# Patient Record
Sex: Male | Born: 1935 | Race: Black or African American | Hispanic: No | Marital: Married | Smoking: Never smoker
Health system: Southern US, Community
[De-identification: ages and names within clinical notes are randomized; demographics above are authoritative.]

## PROBLEM LIST (undated history)

## (undated) DIAGNOSIS — F329 Major depressive disorder, single episode, unspecified: Secondary | ICD-10-CM

## (undated) DIAGNOSIS — H409 Unspecified glaucoma: Secondary | ICD-10-CM

## (undated) DIAGNOSIS — E7800 Pure hypercholesterolemia, unspecified: Secondary | ICD-10-CM

## (undated) DIAGNOSIS — H269 Unspecified cataract: Secondary | ICD-10-CM

## (undated) DIAGNOSIS — F32A Depression, unspecified: Secondary | ICD-10-CM

## (undated) DIAGNOSIS — I10 Essential (primary) hypertension: Secondary | ICD-10-CM

## (undated) DIAGNOSIS — D649 Anemia, unspecified: Secondary | ICD-10-CM

## (undated) DIAGNOSIS — I82409 Acute embolism and thrombosis of unspecified deep veins of unspecified lower extremity: Secondary | ICD-10-CM

## (undated) DIAGNOSIS — I639 Cerebral infarction, unspecified: Secondary | ICD-10-CM

## (undated) DIAGNOSIS — K219 Gastro-esophageal reflux disease without esophagitis: Secondary | ICD-10-CM

## (undated) HISTORY — PX: OTHER SURGICAL HISTORY: SHX169

## (undated) HISTORY — DX: Cerebral infarction, unspecified: I63.9

## (undated) HISTORY — DX: Pure hypercholesterolemia, unspecified: E78.00

## (undated) HISTORY — DX: Essential (primary) hypertension: I10

## (undated) HISTORY — DX: Gastro-esophageal reflux disease without esophagitis: K21.9

## (undated) HISTORY — DX: Unspecified cataract: H26.9

## (undated) HISTORY — DX: Major depressive disorder, single episode, unspecified: F32.9

## (undated) HISTORY — PX: IVC FILTER PLACEMENT (ARMC HX): HXRAD1551

## (undated) HISTORY — DX: Depression, unspecified: F32.A

## (undated) HISTORY — DX: Unspecified glaucoma: H40.9

---

## 2005-02-15 DIAGNOSIS — I639 Cerebral infarction, unspecified: Secondary | ICD-10-CM

## 2005-02-15 HISTORY — DX: Cerebral infarction, unspecified: I63.9

## 2005-08-10 ENCOUNTER — Inpatient Hospital Stay (HOSPITAL_COMMUNITY): Admission: EM | Admit: 2005-08-10 | Discharge: 2005-08-23 | Payer: Self-pay | Admitting: Internal Medicine

## 2005-08-10 ENCOUNTER — Ambulatory Visit: Payer: Self-pay | Admitting: Internal Medicine

## 2005-08-20 ENCOUNTER — Ambulatory Visit: Payer: Self-pay | Admitting: Internal Medicine

## 2005-09-22 ENCOUNTER — Ambulatory Visit: Payer: Self-pay | Admitting: Internal Medicine

## 2005-09-25 ENCOUNTER — Emergency Department (HOSPITAL_COMMUNITY): Admission: EM | Admit: 2005-09-25 | Discharge: 2005-09-25 | Payer: Self-pay | Admitting: Emergency Medicine

## 2005-09-28 ENCOUNTER — Ambulatory Visit: Payer: Self-pay | Admitting: Internal Medicine

## 2005-09-28 ENCOUNTER — Inpatient Hospital Stay (HOSPITAL_COMMUNITY): Admission: EM | Admit: 2005-09-28 | Discharge: 2005-10-02 | Payer: Self-pay | Admitting: Emergency Medicine

## 2005-10-04 ENCOUNTER — Ambulatory Visit: Payer: Self-pay | Admitting: Internal Medicine

## 2005-10-15 ENCOUNTER — Ambulatory Visit: Payer: Self-pay | Admitting: Internal Medicine

## 2005-10-25 ENCOUNTER — Ambulatory Visit (HOSPITAL_COMMUNITY): Admission: RE | Admit: 2005-10-25 | Discharge: 2005-10-25 | Payer: Self-pay | Admitting: Internal Medicine

## 2005-10-25 ENCOUNTER — Ambulatory Visit: Admission: RE | Admit: 2005-10-25 | Discharge: 2005-10-25 | Payer: Self-pay | Admitting: Internal Medicine

## 2005-11-02 ENCOUNTER — Emergency Department (HOSPITAL_COMMUNITY): Admission: EM | Admit: 2005-11-02 | Discharge: 2005-11-03 | Payer: Self-pay | Admitting: Emergency Medicine

## 2005-11-02 ENCOUNTER — Ambulatory Visit: Payer: Self-pay | Admitting: Internal Medicine

## 2005-11-04 ENCOUNTER — Ambulatory Visit: Payer: Self-pay | Admitting: Internal Medicine

## 2005-11-16 ENCOUNTER — Inpatient Hospital Stay (HOSPITAL_COMMUNITY): Admission: EM | Admit: 2005-11-16 | Discharge: 2005-11-18 | Payer: Self-pay | Admitting: Emergency Medicine

## 2005-11-18 ENCOUNTER — Ambulatory Visit: Payer: Self-pay | Admitting: Physical Medicine & Rehabilitation

## 2005-11-19 ENCOUNTER — Inpatient Hospital Stay (HOSPITAL_COMMUNITY): Admission: EM | Admit: 2005-11-19 | Discharge: 2005-11-26 | Payer: Self-pay | Admitting: Emergency Medicine

## 2005-11-24 ENCOUNTER — Ambulatory Visit: Payer: Self-pay | Admitting: Internal Medicine

## 2006-05-05 ENCOUNTER — Ambulatory Visit (HOSPITAL_COMMUNITY): Admission: RE | Admit: 2006-05-05 | Discharge: 2006-05-05 | Payer: Self-pay | Admitting: *Deleted

## 2006-05-25 ENCOUNTER — Ambulatory Visit: Payer: Self-pay | Admitting: Gastroenterology

## 2006-06-03 ENCOUNTER — Ambulatory Visit: Payer: Self-pay | Admitting: Gastroenterology

## 2006-06-03 ENCOUNTER — Encounter (INDEPENDENT_AMBULATORY_CARE_PROVIDER_SITE_OTHER): Payer: Self-pay | Admitting: Specialist

## 2006-06-08 ENCOUNTER — Ambulatory Visit: Payer: Self-pay | Admitting: Gastroenterology

## 2006-06-16 ENCOUNTER — Ambulatory Visit (HOSPITAL_COMMUNITY): Admission: RE | Admit: 2006-06-16 | Discharge: 2006-06-16 | Payer: Self-pay | Admitting: Gastroenterology

## 2006-06-16 ENCOUNTER — Encounter (INDEPENDENT_AMBULATORY_CARE_PROVIDER_SITE_OTHER): Payer: Self-pay | Admitting: Specialist

## 2006-07-13 ENCOUNTER — Ambulatory Visit: Payer: Self-pay | Admitting: Gastroenterology

## 2006-08-11 ENCOUNTER — Encounter: Admission: RE | Admit: 2006-08-11 | Discharge: 2006-11-09 | Payer: Self-pay | Admitting: Internal Medicine

## 2006-11-08 ENCOUNTER — Ambulatory Visit (HOSPITAL_COMMUNITY): Admission: RE | Admit: 2006-11-08 | Discharge: 2006-11-08 | Payer: Self-pay | Admitting: Internal Medicine

## 2006-11-24 ENCOUNTER — Encounter: Admission: RE | Admit: 2006-11-24 | Discharge: 2007-02-22 | Payer: Self-pay | Admitting: Internal Medicine

## 2006-12-21 ENCOUNTER — Encounter: Admission: RE | Admit: 2006-12-21 | Discharge: 2006-12-21 | Payer: Self-pay | Admitting: Internal Medicine

## 2007-01-19 ENCOUNTER — Emergency Department (HOSPITAL_COMMUNITY): Admission: EM | Admit: 2007-01-19 | Discharge: 2007-01-19 | Payer: Self-pay | Admitting: Emergency Medicine

## 2007-02-06 ENCOUNTER — Ambulatory Visit (HOSPITAL_COMMUNITY): Admission: RE | Admit: 2007-02-06 | Discharge: 2007-02-06 | Payer: Self-pay | Admitting: Gastroenterology

## 2007-02-06 DIAGNOSIS — D1330 Benign neoplasm of unspecified part of small intestine: Secondary | ICD-10-CM | POA: Insufficient documentation

## 2007-02-07 ENCOUNTER — Ambulatory Visit (HOSPITAL_COMMUNITY): Admission: RE | Admit: 2007-02-07 | Discharge: 2007-02-07 | Payer: Self-pay | Admitting: Gastroenterology

## 2007-02-14 ENCOUNTER — Ambulatory Visit: Payer: Self-pay | Admitting: Gastroenterology

## 2007-03-29 ENCOUNTER — Ambulatory Visit: Payer: Self-pay | Admitting: Internal Medicine

## 2007-03-29 LAB — CONVERTED CEMR LAB
Basophils Absolute: 0 10*3/uL (ref 0.0–0.1)
Basophils Relative: 0 % (ref 0.0–1.0)
HCT: 39.8 % (ref 39.0–52.0)
Hemoglobin: 13 g/dL (ref 13.0–17.0)
MCHC: 32.6 g/dL (ref 30.0–36.0)
Monocytes Absolute: 0.6 10*3/uL (ref 0.2–0.7)
Neutrophils Relative %: 45 % (ref 43.0–77.0)
RBC: 4.51 M/uL (ref 4.22–5.81)
RDW: 13.4 % (ref 11.5–14.6)

## 2007-04-02 ENCOUNTER — Inpatient Hospital Stay (HOSPITAL_COMMUNITY): Admission: EM | Admit: 2007-04-02 | Discharge: 2007-04-04 | Payer: Self-pay | Admitting: Emergency Medicine

## 2007-04-19 ENCOUNTER — Ambulatory Visit: Payer: Self-pay | Admitting: Gastroenterology

## 2007-05-24 ENCOUNTER — Ambulatory Visit: Payer: Self-pay | Admitting: Gastroenterology

## 2007-06-26 ENCOUNTER — Encounter: Payer: Self-pay | Admitting: Gastroenterology

## 2007-07-04 ENCOUNTER — Telehealth: Payer: Self-pay | Admitting: Gastroenterology

## 2008-01-08 IMAGING — RF DG UGI W/ HIGH DENSITY W/KUB
15 of 19 series · 15 of 19 positions shown · non-contrast
Comparison: none

CLINICAL DATA: Duodenal mass, nausea, vomiting.  
UPPER GI:
TECHNIQUE: Only a limited study could be performed, as the patient was unable to swallow in substantial quantities.  Thin barium was used with chin tuck throughout the procedure in this patient with stroke.  There was also difficulty positioning the patient due to his inability to bear weight and contractures from prior stroke.

[Series 1: run · 1 of 1 slices shown (1 of 14)]
[im 1/1]
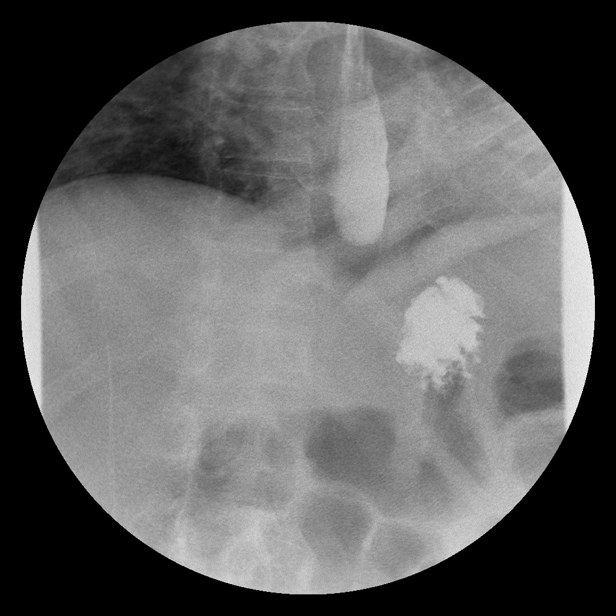

[Series 2: run · 1 of 1 slices shown (2 of 14)]
[im 1/1]
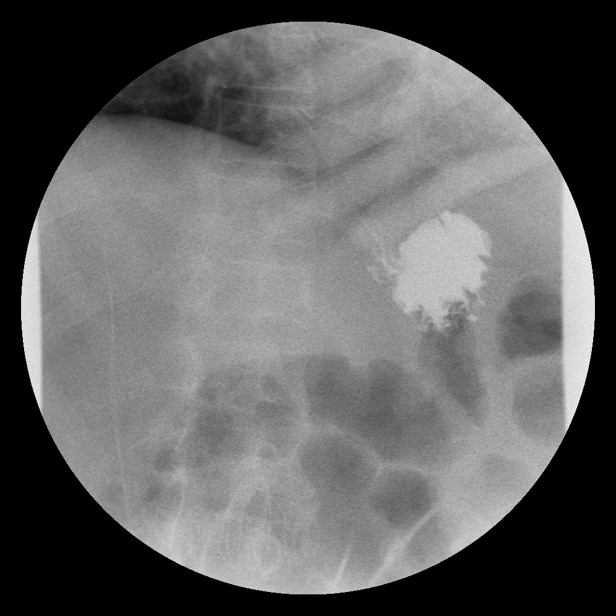

[Series 4: run · 1 of 1 slices shown (3 of 14)]
[im 1/1]
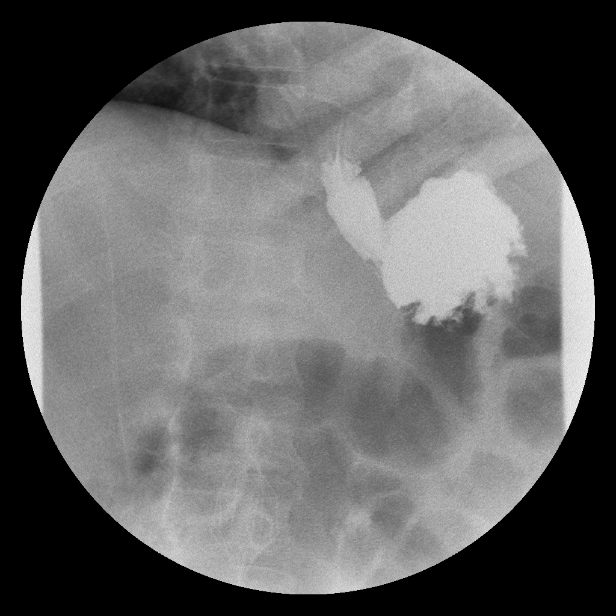

[Series 5: run · 1 of 1 slices shown (4 of 14)]
[im 1/1]
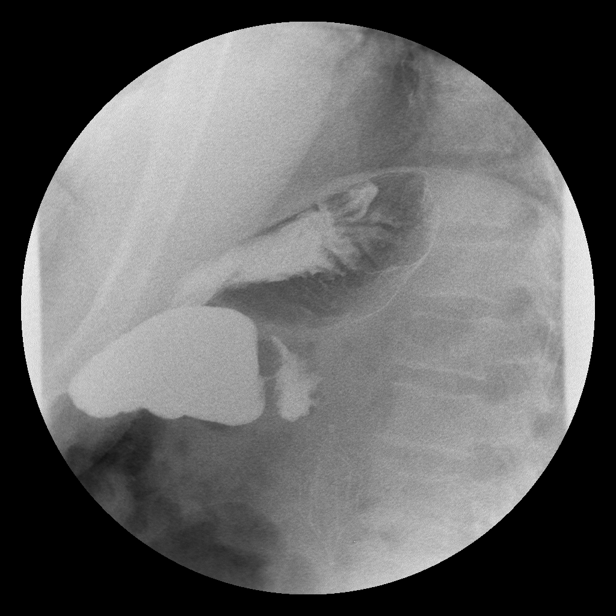

[Series 6: run · 1 of 1 slices shown (5 of 14)]
[im 1/1]
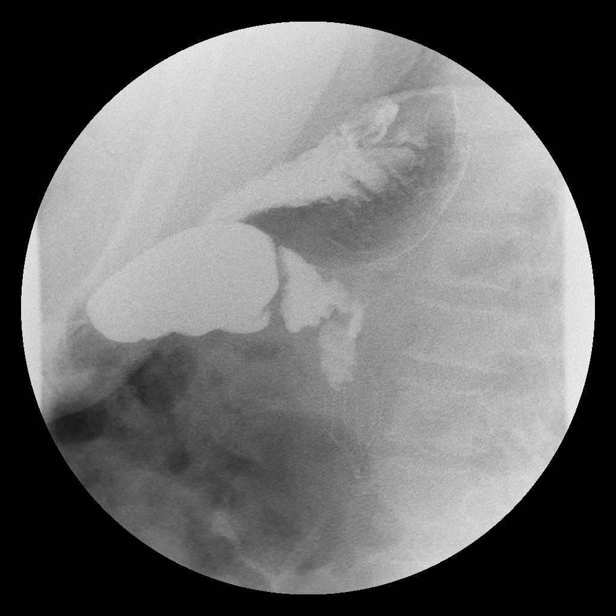

[Series 7: run · 1 of 1 slices shown (6 of 14)]
[im 1/1]
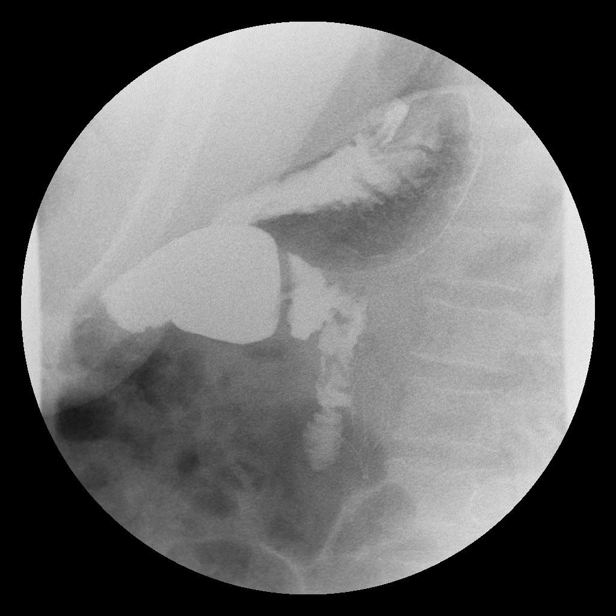

[Series 9: run · 1 of 1 slices shown (7 of 14)]
[im 1/1]
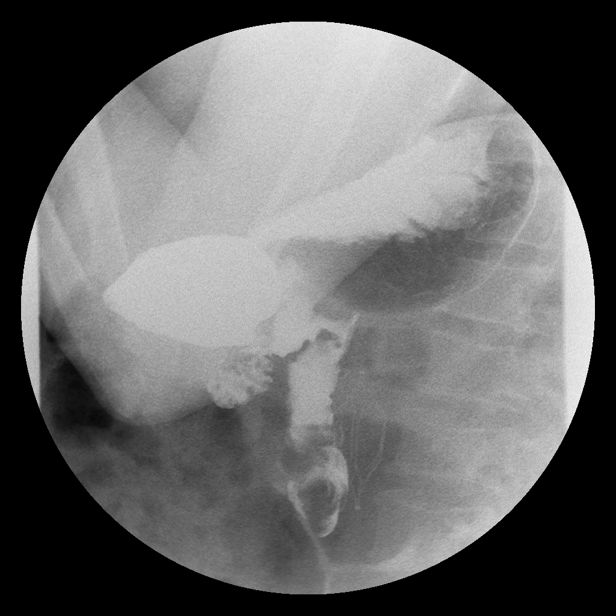

[Series 10: run · 1 of 1 slices shown (8 of 14)]
[im 1/1]
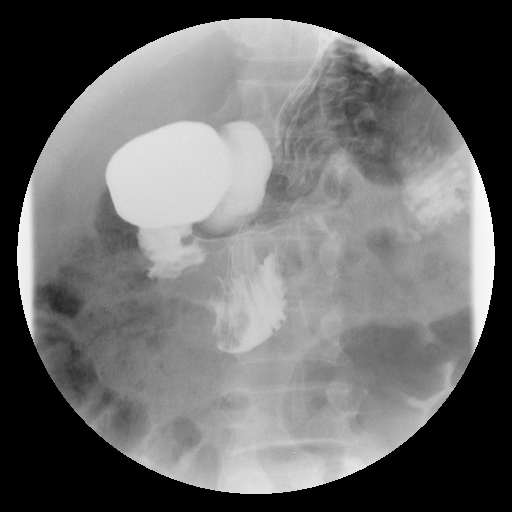

[Series 11: run · 1 of 1 slices shown (9 of 14)]
[im 1/1]
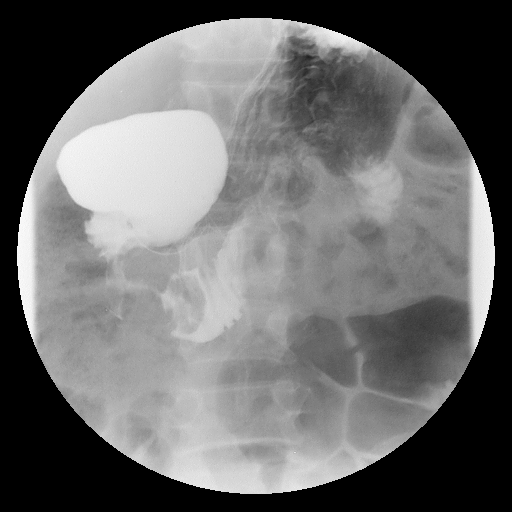

[Series 13: run · 1 of 1 slices shown (10 of 14)]
[im 1/1]
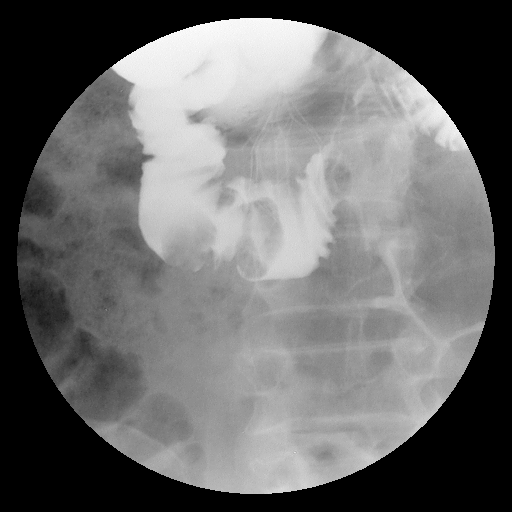

[Series 14: run · 1 of 1 slices shown (11 of 14)]
[im 1/1]
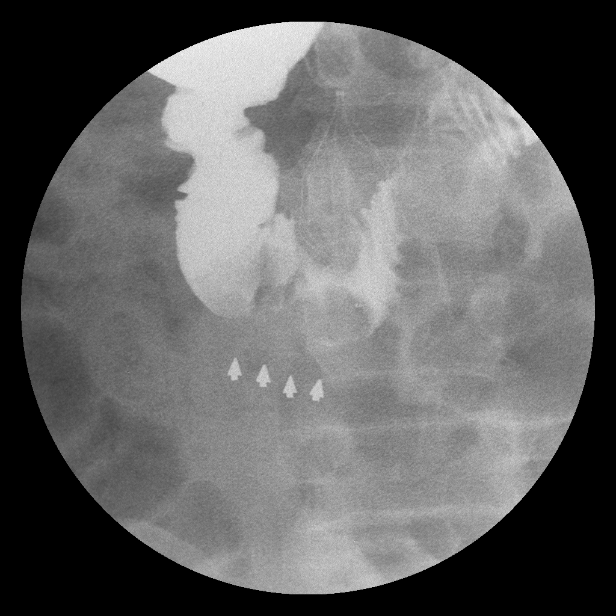

[Series 15: run · 1 of 1 slices shown (12 of 14)]
[im 1/1]
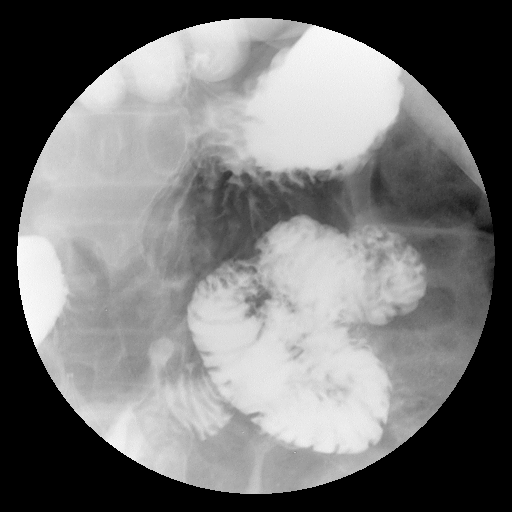

[Series 16: run · 1 of 1 slices shown (13 of 14)]
[im 1/1]
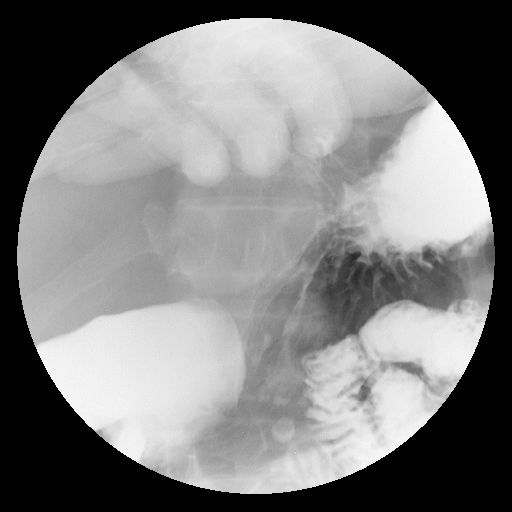

[Series 18: run · 1 of 1 slices shown (14 of 14)]
[im 1/1]
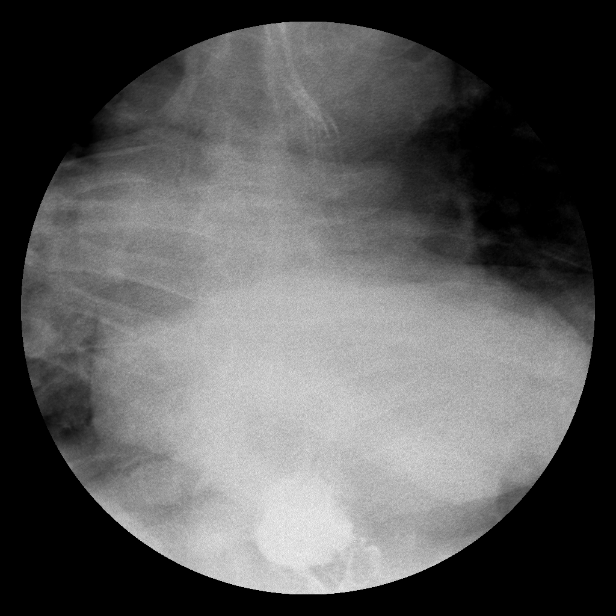

[Series 1001: view not recorded · 0.20mm/px · 1 of 1 slices shown]
[im 1/1]
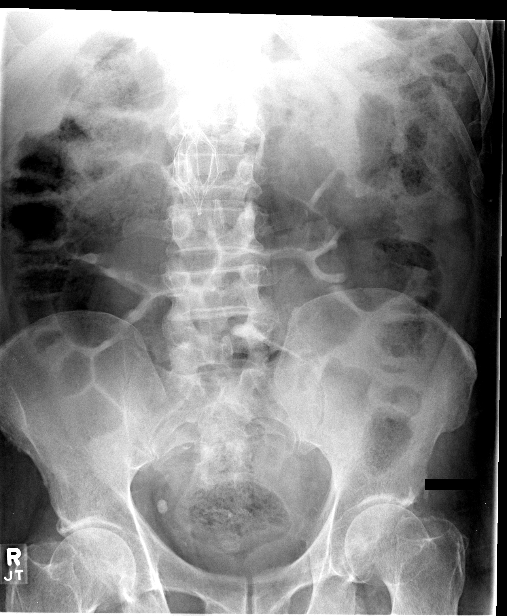

[15 of 19 positions shown; findings below may reference images not displayed]

FINDINGS: The study was begun in the left side down decubitus position with moderate filling of the proximal stomach.  After this contained a sufficient amount of barium contrast, the patient was re-positioned in the right sound down decubitus position.  There was no extravasation of contrast from the stomach observed through the percutaneous gastrostomy tract.  IVC filter was noted.   After positioning the right side down, the stomach emptied normally.  No large mass lesions were present within the stomach.  The duodenal rotation was normal.  At the junction of the second and third parts of the duodenum, there was a lobulated mass associated with the inferior wall of the duodenum with mild mass effect on the duodenal lumen.  Liquid contrast freely passed beyond this.  This lesion had acute margins with the wall of the duodenum, suggesting a mucosal origin.  Third and fourth parts of the duodenum were normal, and visualized portions of the proximal jejunum were unremarkable.  Esophageal motility was also within normal limits without focal esophageal mass lesions, although the examination of the esophagus was somewhat limited due to patient?s inability to perform a full column esophagram.
IMPRESSION: Approximately 5 cm mucosal lesion at the junction of the second and third parts of the duodenum on the dependent wall.  This has mild mass effect on the lumen and no significant obstruction to liquids is present.  Residual luminal diameter is estimated at 1 cm, constituting a low-grade obstructive lesion.

## 2008-08-14 ENCOUNTER — Ambulatory Visit: Payer: Self-pay | Admitting: Internal Medicine

## 2008-08-14 ENCOUNTER — Inpatient Hospital Stay (HOSPITAL_COMMUNITY): Admission: EM | Admit: 2008-08-14 | Discharge: 2008-08-20 | Payer: Self-pay | Admitting: Emergency Medicine

## 2008-08-15 ENCOUNTER — Encounter: Payer: Self-pay | Admitting: Internal Medicine

## 2008-08-20 ENCOUNTER — Encounter (INDEPENDENT_AMBULATORY_CARE_PROVIDER_SITE_OTHER): Payer: Self-pay | Admitting: *Deleted

## 2008-09-06 DIAGNOSIS — E876 Hypokalemia: Secondary | ICD-10-CM | POA: Insufficient documentation

## 2008-10-23 ENCOUNTER — Emergency Department (HOSPITAL_COMMUNITY): Admission: EM | Admit: 2008-10-23 | Discharge: 2008-10-23 | Payer: Self-pay | Admitting: Emergency Medicine

## 2008-10-23 ENCOUNTER — Encounter (INDEPENDENT_AMBULATORY_CARE_PROVIDER_SITE_OTHER): Payer: Self-pay | Admitting: *Deleted

## 2009-03-10 ENCOUNTER — Encounter: Payer: Self-pay | Admitting: Gastroenterology

## 2009-03-11 ENCOUNTER — Encounter: Payer: Self-pay | Admitting: Gastroenterology

## 2009-03-15 ENCOUNTER — Encounter: Payer: Self-pay | Admitting: Gastroenterology

## 2009-03-24 ENCOUNTER — Ambulatory Visit: Payer: Self-pay | Admitting: Gastroenterology

## 2009-03-24 DIAGNOSIS — D509 Iron deficiency anemia, unspecified: Secondary | ICD-10-CM

## 2009-03-24 DIAGNOSIS — I6350 Cerebral infarction due to unspecified occlusion or stenosis of unspecified cerebral artery: Secondary | ICD-10-CM | POA: Insufficient documentation

## 2009-03-27 ENCOUNTER — Ambulatory Visit: Payer: Self-pay | Admitting: Gastroenterology

## 2009-04-01 ENCOUNTER — Telehealth: Payer: Self-pay | Admitting: Gastroenterology

## 2009-04-01 ENCOUNTER — Encounter: Payer: Self-pay | Admitting: Gastroenterology

## 2009-04-16 ENCOUNTER — Encounter: Payer: Self-pay | Admitting: Gastroenterology

## 2009-04-17 ENCOUNTER — Encounter: Payer: Self-pay | Admitting: Gastroenterology

## 2009-05-02 ENCOUNTER — Encounter: Payer: Self-pay | Admitting: Gastroenterology

## 2009-05-06 ENCOUNTER — Encounter: Payer: Self-pay | Admitting: Gastroenterology

## 2009-05-09 ENCOUNTER — Ambulatory Visit: Payer: Self-pay | Admitting: Gastroenterology

## 2009-06-09 ENCOUNTER — Encounter: Payer: Self-pay | Admitting: Gastroenterology

## 2009-07-28 ENCOUNTER — Encounter: Payer: Self-pay | Admitting: Gastroenterology

## 2009-08-19 ENCOUNTER — Ambulatory Visit (HOSPITAL_COMMUNITY): Admission: RE | Admit: 2009-08-19 | Discharge: 2009-08-19 | Payer: Self-pay | Admitting: Internal Medicine

## 2009-08-19 ENCOUNTER — Telehealth: Payer: Self-pay | Admitting: Gastroenterology

## 2009-09-25 ENCOUNTER — Inpatient Hospital Stay (HOSPITAL_COMMUNITY): Admission: EM | Admit: 2009-09-25 | Discharge: 2009-09-29 | Payer: Self-pay | Admitting: Emergency Medicine

## 2010-03-09 ENCOUNTER — Encounter: Payer: Self-pay | Admitting: Internal Medicine

## 2010-03-17 NOTE — Procedures (Signed)
Summary: Recall Assessment/[facility]  Recall Assessment/[facility]   Imported By: Sherian Rein 09/18/2009 11:11:54  _____________________________________________________________________  External Attachment:    Type:   Image     Comment:   External Document

## 2010-03-17 NOTE — Progress Notes (Signed)
Summary: triage   Phone Note From Other Clinic Call back at [phone]   Caller: Amy PA from [facility] Call For: [name] Reason for Call: Schedule Patient Appt Summary of Call: Amy Black PA for [facility] would like this patient seen by anyone asap states that he has been vomitting coffee ground suff. Initial call taken by: Tawni Levy,  August 19, 2009 8:43 AM  Follow-up for Phone Call        message left for PA to call me back. Follow-up by: Teryl Lucy RN,  August 19, 2009 9:29 AM  Additional Follow-up for Phone Call Additional follow up Details #1::        Pt. discussed with Dr.Brodie(Dr.of the day) and he needs sent to ER.for evaluation by the er. Dr. Precious Haws left for PA to call me back.[facility] contacted at [phone] and [place] paged Amy .Pt's vitals stable and Amy contacted pt's wife and he went home for the holidays and wife did not give him his medication so now that they know that the will take care of it at facility.  Additional Follow-up by: Teryl Lucy RN,  August 19, 2009 9:53 AM    Additional Follow-up for Phone Call Additional follow up Details #2::    discussed and agree. Follow-up by: Hart Carwin MD,  August 19, 2009 10:18 AM  Additional Follow-up for Phone Call Additional follow up Details #3:: Details for Additional Follow-up Action Taken: ok Additional Follow-up by: Louis Meckel MD,  August 21, 2009 8:23 AM

## 2010-03-17 NOTE — Assessment & Plan Note (Signed)
Summary: BLOOD IN STOOLS...EM    History of Present Illness Visit Type: consult  Primary GI MD: Melvia Heaps MD [facility] Primary Provider: Karlene Einstein, MD Requesting Provider: Karlene Einstein, MD Chief Complaint: blood in stools History of Present Illness:   [name] has returned for evaluation of a drop in hemoglobin.  He apparently has had several episodes of rectal bleeding.  He denies rectal pain.  He has a history of GI bleeding from esophagitis.  He has a well-established villous adenoma with dysplasia,  in the duodenum.  At the end of September, 2010 hemoglobin was 13.6.  On March 11, 2009 hemoglobin was 10.8.  MCV is 80.  He takes Carafate and Prilosec daily.  Family history is pertinent for his father with colon cancer.  Patient has a feeding gastrostomy tube.  He has hemiplegia secondary to a CVA   GI Review of Systems      Denies abdominal pain, acid reflux, belching, bloating, chest pain, dysphagia with liquids, dysphagia with solids, heartburn, loss of appetite, nausea, vomiting, vomiting blood, weight loss, and  weight gain.      Reports rectal bleeding.     Denies anal fissure, black tarry stools, change in bowel habit, constipation, diarrhea, diverticulosis, fecal incontinence, heme positive stool, hemorrhoids, irritable bowel syndrome, jaundice, light color stool, liver problems, and  rectal pain.    Current Medications (verified): 1)  Vitamin D (Ergocalciferol) 50000 Unit Caps (Ergocalciferol) .... One Capsule By Mouth Once Monthly 2)  Polyethylene Glycol 3350  Liqd (Polyethylene Glycol 3350) .Marland Kitchen.. 17grams Daily At Bedtime 3)  Potassium Chloride Crys Cr 10 Meq Cr-Tabs (Potassium Chloride Crys Cr) .... One Tablet By Mouth Once Daily 4)  Prilosec 20 Mg Cpdr (Omeprazole) .... One Tablet By Mouth Two Times A Day 5)  Sertraline Hcl 50 Mg Tabs (Sertraline Hcl) .... One Tablet By Mouth Once Daily 6)  Simvastatin 40 Mg Tabs (Simvastatin) .... One Tablet By Mouth Once  Daily 7)  Sucralfate 1 Gm Tabs (Sucralfate) .... One Tablet By Mouth Four Times A Day  Allergies (verified): No Known Drug Allergies  Past History:  Past Medical History: Duodenal Polyp CVA PEG tube site infection GERD Depression Hyperlipidemia Hypertension Hemiplegia---Right Knee and Left Ankle  Past Surgical History: Reviewed history from 09/06/2008 and no changes required. status post PEG placement  Family History: Family History of Colon Cancer:Father   Social History: Occupation: Retired Married Patient has never smoked.  Alcohol Use - no Illicit Drug Use - no Smoking Status:  never Drug Use:  no  Review of Systems  The patient denies allergy/sinus, anemia, anxiety-new, arthritis/joint pain, back pain, blood in urine, breast changes/lumps, change in vision, confusion, cough, coughing up blood, depression-new, fainting, fatigue, fever, headaches-new, hearing problems, heart murmur, heart rhythm changes, itching, muscle pains/cramps, night sweats, nosebleeds, shortness of breath, skin rash, sleeping problems, sore throat, swelling of feet/legs, swollen lymph glands, thirst - excessive, urination - excessive, urination changes/pain, urine leakage, vision changes, and voice change.    Vital Signs:  Patient profile:   76 year old male Height:      69 inches Weight:      184 pounds BMI:     27.27 BSA:     2.00 Pulse rate:   60 / minute Pulse rhythm:   regular BP sitting:   124 / 62  (left arm) Cuff size:   regular  Vitals Entered By: Ok Anis CMA (March 24, 2009 3:00 PM)  Physical Exam  Additional Exam:  Patient  is a well-developed well-nourished male examined while sitting in a wheelchair  skin: anicteric HEENT: normocephalic; PEERLA; no nasal or pharyngeal abnormalities neck: supple nodes: no cervical lymphadenopathy chest: clear to ausculatation and percussion heart: no murmurs, gallops, or rubs abd: soft, nontender; BS normoactive; no abdominal  masses, tenderness, organomegaly rectal: deferred ext: no cynanosis, clubbing, edema skeletal: no deformities neuro: oriented x 3; no focal abnormalities    Impression & Recommendations:  Problem # 1:  ANEMIA, HYPOCHROMIC (ICD-280.9)  Patient likely has an iron deficiency anemia, perhaps from chronic bleeding from his duodenal polyp.  A colonic source needs to be ruled out in view of his history of rectal bleeding and family history of colorectal cancer.  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 You will need to arrive on the 4th floor at 1:30pm 3)  We are giving you a printed rx for your MoviPrep today 4)  Colonoscopy and Flexible Sigmoidoscopy brochure given.  5)  Conscious Sedation brochure given.  6)  Upper Endoscopy brochure given.  7)  The medication list was reviewed and reconciled.  All changed / newly prescribed medications were explained.  A complete medication list was provided to the patient / caregiver. Prescriptions: MOVIPREP 100 GM  SOLR (PEG-KCL-NACL-NASULF-NA ASC-C) As per prep instructions.  #1 x 0   Entered by:   Merri Ray CMA (AAMA)   Authorized by:   Louis Meckel MD   Signed by:   Merri Ray CMA (AAMA) on 03/24/2009   Method used:   Print then Give to Patient   RxID:   [id number]

## 2010-03-17 NOTE — Assessment & Plan Note (Signed)
Summary: F/U FROM ENDO. AND RECENT LABS           Tyler Beard    History of Present Illness Visit Type: Follow-up Visit Primary GI MD: Melvia Heaps MD [facility] Primary Provider: Karlene Einstein, MD Requesting Provider: na Chief Complaint: Patient here to follow up after endo and recent labs. States that he is feeling good.  History of Present Illness:   Tyler Beard has returned from an upper and lower endoscopy.  Colonoscopy was normal.  Again noted at upper endoscopy was a large duodenal polyp.  Although the lumen was partially obstructed the endoscope could easily get past the area.  Biopsies demonstrated a villous adenoma.  It was felt that the source for his iron deficiency anemia is this villous adenoma.  He was placed on iron twice a day.  Hemoglobin from several days ago was 10.6, which is stable.  He has no GI complaints.   GI Review of Systems      Denies abdominal pain, acid reflux, belching, bloating, chest pain, dysphagia with liquids, dysphagia with solids, heartburn, loss of appetite, nausea, vomiting, vomiting blood, weight loss, and  weight gain.        Denies anal fissure, black tarry stools, change in bowel habit, constipation, diarrhea, diverticulosis, fecal incontinence, heme positive stool, hemorrhoids, irritable bowel syndrome, jaundice, light color stool, liver problems, rectal bleeding, and  rectal pain.    Current Medications (verified): 1)  Vitamin D (Ergocalciferol) 50000 Unit Caps (Ergocalciferol) .... One Capsule By Mouth Once Monthly 2)  Polyethylene Glycol 3350  Liqd (Polyethylene Glycol 3350) .Marland Kitchen.. 17grams Daily At Bedtime 3)  Potassium Chloride Crys Cr 10 Meq Cr-Tabs (Potassium Chloride Crys Cr) .... One Tablet By Mouth Once Daily 4)  Prilosec 20 Mg Cpdr (Omeprazole) .... One Tablet By Mouth Two Times A Day 5)  Sertraline Hcl 50 Mg Tabs (Sertraline Hcl) .... One Tablet By Mouth Once Daily 6)  Simvastatin 40 Mg Tabs (Simvastatin) .... One Tablet By Mouth Once  Daily 7)  Sucralfate 1 Gm Tabs (Sucralfate) .... One Tablet By Mouth Four Times A Day 8)  Ferrous Sulfate 325 (65 Fe) Mg Tabs (Ferrous Sulfate) .... Take One By Mouth Two Times A Day 9)  Miralax  Powd (Polyethylene Glycol 3350) .... Mix 17 Grams in 8 Oz Water and Drink At Bedtime  Allergies (verified): No Known Drug Allergies  Past History:  Past Medical History: Reviewed history from 03/24/2009 and no changes required. Duodenal Polyp CVA PEG tube site infection GERD Depression Hyperlipidemia Hypertension Hemiplegia---Right Knee and Left Ankle  Past Surgical History: status post PEG placement - removed  Family History: Family History of Colon Cancer: Father  Family History of Lung Cancer: Sister (smoker)  Social History: Occupation: Retired Patient lives at [employer] [phone] Married Patient has never smoked.  Alcohol Use - no Illicit Drug Use - no  Review of Systems  The patient denies allergy/sinus, anemia, anxiety-new, arthritis/joint pain, back pain, blood in urine, breast changes/lumps, change in vision, confusion, cough, coughing up blood, depression-new, fainting, fatigue, fever, headaches-new, hearing problems, heart murmur, heart rhythm changes, itching, menstrual pain, muscle pains/cramps, night sweats, nosebleeds, pregnancy symptoms, shortness of breath, skin rash, sleeping problems, sore throat, swelling of feet/legs, swollen lymph glands, thirst - excessive , urination - excessive , urination changes/pain, urine leakage, vision changes, and voice change.    Vital Signs:  Patient profile:   76 year old male Height:      69 inches Pulse rate:   76 /  minute Pulse rhythm:   regular BP sitting:   122 / 70  (left arm) Cuff size:   regular  Vitals Entered By: Harlow Mares CMA Duncan Dull) (May 09, 2009 2:14 PM)   Impression & Recommendations:  Problem # 1:  ANEMIA, HYPOCHROMIC (ICD-280.9) Assessment Unchanged He has  chronic GI blood loss from this  large duodenal villous adenoma.  Provided that he can maintain his blood counts with supplementary iron no further therapy is necessary.  Recommendations #1 continue iron supplementation #2 check CBC monthly.  This will be done at his nursing home.  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  Though it partially obstructs the lumen it is not causing a functional obstruction.  Recommendations #1 no endoscopic therapy at this time.  If he develops obstructive symptoms then I would consider a duodenal stent or partial removal of the polyp  Weight was not documented today as patient unable to stand; BMI could not be calculated.  Patient Instructions: 1)  CBC to be checked monthly at [facility] 2)  The medication list was reviewed and reconciled.  All changed / newly prescribed medications were explained.  A complete medication list was provided to the patient / caregiver. 3)  cc Karlene Einstein, MD

## 2010-03-17 NOTE — Letter (Signed)
Summary: Appt Reminder 2  [facility]  [address] [place], [place] [address]   Phone: [phone]  Fax: [phone]        April 01, 2009 MRN: [id number]    EUAL LINDSTROM [address] [place], [place]  [address]    Dear Mr. MCZEAL,   You have a return appointment with Dr.Primitivo Merkey [name] on 05-09-09 at 1:45pm. Please remember to bring a complete list of the medicines you are taking, your insurance card and your co-pay.  If you have to cancel or reschedule this appointment, please call before 5:00 pm the evening before to avoid a cancellation fee.  If you have any questions or concerns, please call [phone].    Sincerely,    Laureen Ochs LPN  Appended Document: Appt Reminder 2 Faxed to [facility] at [phone], Attn:Nicki.

## 2010-03-17 NOTE — Progress Notes (Signed)
Summary: Labs and REV Scheduled   Phone Note Outgoing Call   Call placed by: Laureen Ochs LPN,  April 01, 2009 4:09 PM Summary of Call: Called pt. to schedule f/u labs and ov, pt. wife wants me to contact pt's nurse at [facility]. I spoke with Nicki, she is aware of appts. I have also faxed orders for pt. to have a CBCD drawn on 05-02-09 and results faxed to [phone] and an appt. reminder for pt. on 05-09-09 at 1:45pm.  Initial call taken by: Laureen Ochs LPN,  April 01, 2009 4:13 PM

## 2010-03-17 NOTE — Miscellaneous (Signed)
  Clinical Lists Changes  Medications: Added new medication of FEOSOL 200 (65 FE) MG TABS (FERROUS SULFATE DRIED) take 1 tab once daily - Signed Rx of FEOSOL 200 (65 FE) MG TABS (FERROUS SULFATE DRIED) take 1 tab once daily;  #30 x 5;  Signed;  Entered by: Louis Meckel MD;  Authorized by: Louis Meckel MD;  Method used: Electronically to [facility]*, [address] [place], [place], [place]  [address], Ph: [phone], Fax: [phone]    Prescriptions: FEOSOL 200 (65 FE) MG TABS (FERROUS SULFATE DRIED) take 1 tab once daily  #30 x 5   Entered and Authorized by:   Louis Meckel MD   Signed by:   Louis Meckel MD on 03/27/2009   Method used:   Electronically to        [facility]* (retail)       [address] [place], [place]  [address]       Ph: [phone]       Fax: [phone]   RxID:   [id number]

## 2010-03-17 NOTE — Procedures (Signed)
Summary: Upper Endoscopy  Patient: Marten Iles Note: All result statuses are Final unless otherwise noted.  Tests: (1) Upper Endoscopy (EGD)   EGD Upper Endoscopy       DONE     [facility]     [address]     [place], [place]  [address]           ENDOSCOPY PROCEDURE REPORT           PATIENT:  Barnet, Benavides  MR#:  [id number]     BIRTHDATE:  1934/07/04, 74 yrs. old  GENDER:  male           ENDOSCOPIST:  [name]. [name], MD     Referred by:           PROCEDURE DATE:  03/27/2009     PROCEDURE:  EGD with biopsy     ASA CLASS:  Class III     INDICATIONS:  iron deficiency anemia; h/o duodenal mass           MEDICATIONS:   There was residual sedation effect present from     prior procedure., Fentanyl 25 mcg IV, Versed 3 mg IV,     glycopyrrolate (Robinal) 0.2 mg IV, 0.6cc simethancone 0.6 cc PO     TOPICAL ANESTHETIC:  Exactacain Spray           DESCRIPTION OF PROCEDURE:   After the risks benefits and     alternatives of the procedure were thoroughly explained, informed     consent was obtained.  The LB GIF-H180 [id number] endoscope was     introduced through the mouth and advanced to the third portion of     the duodenum, without limitations.  The instrument was slowly     withdrawn as the mucosa was fully examined.     <<PROCEDUREIMAGES>>           A villous mass was found in the second portion of the duodenum.     Previously described villous mass measuring at least 4-5cm; mucosa     is friable. Bxs taken. Lumen is partially obstructed. Able to     maneuver scope through (see image1, image2, and image3).     Otherwise the examination was normal.    Retroflexed views revealed     no abnormalities.    The scope was then withdrawn from the patient     and the procedure completed.           COMPLICATIONS:  None           ENDOSCOPIC IMPRESSION:     1) Villous mass in the second portion duodenum - etiology for Fe     deficiency anemia     2) Otherwise normal  examination     RECOMMENDATIONS:     1) Call office next 2-3 days to schedule an office appointment     for 6 weeks     2) begin iron supplementation     3) CBC 6 weeks           REPEAT EXAM:  No           ______________________________     [name]. [name], MD           CC:  Karlene Einstein, MD           n.     Rosalie DoctorBarbette Hair. Deaaron Fulghum at 03/27/2009 04:04 PM           Doyce Loose,  [id number]  Note: An exclamation mark (!) indicates a result that was not dispersed into the flowsheet. Document Creation Date: 03/27/2009 4:04 PM _______________________________________________________________________  (1) Order result status: Final Collection or observation date-time: 03/27/2009 15:56 Requested date-time:  Receipt date-time:  Reported date-time:  Referring Physician:   Ordering Physician: Melvia Heaps [phone]) Specimen Source:  Source: Launa Grill Order Number: [id number] Lab site:

## 2010-03-17 NOTE — Letter (Signed)
Summary: [facility] Instructions  [facility]  [address] [place], [place] [address]   Phone: [phone]  Fax: [phone]       Tyler Beard    [birth year]    MRN: [id number]        Procedure Day /Date:THURSDAY 03/27/2009     Arrival Time:1:30PM     Procedure Time:2:30PM     Location of Procedure:                    X    [facility] (4th Floor)                        PREPARATION FOR COLONOSCOPY WITH MOVIPREP/ENDO   Starting 5 days prior to your procedure TODAY do not eat nuts, seeds, popcorn, corn, beans, peas,  salads, or any raw vegetables.  Do not take any fiber supplements (e.g. Metamucil, Citrucel, and Benefiber).  THE DAY BEFORE YOUR PROCEDURE         DATE: 03/26/2009  DAY: WED  1.  Drink clear liquids the entire day-NO SOLID FOOD  2.  Do not drink anything colored red or purple.  Avoid juices with pulp.  No orange juice.  3.  Drink at least 64 oz. (8 glasses) of fluid/clear liquids during the day to prevent dehydration and help the prep work efficiently.  CLEAR LIQUIDS INCLUDE: Water Jello Ice Popsicles Tea (sugar ok, no milk/cream) Powdered fruit flavored drinks Coffee (sugar ok, no milk/cream) Gatorade Juice: apple, white grape, white cranberry  Lemonade Clear bullion, consomm, broth Carbonated beverages (any kind) Strained chicken noodle soup Hard Candy                             4.  In the morning, mix first dose of MoviPrep solution:    Empty 1 Pouch A and 1 Pouch B into the disposable container    Add lukewarm drinking water to the top line of the container. Mix to dissolve    Refrigerate (mixed solution should be used within 24 hrs)  5.  Begin drinking the prep at 5:00 p.m. The MoviPrep container is divided by 4 marks.   Every 15 minutes drink the solution down to the next mark (approximately 8 oz) until the full liter is complete.   6.  Follow completed prep with 16 oz of clear liquid of your choice (Nothing red  or purple).  Continue to drink clear liquids until bedtime.  7.  Before going to bed, mix second dose of MoviPrep solution:    Empty 1 Pouch A and 1 Pouch B into the disposable container    Add lukewarm drinking water to the top line of the container. Mix to dissolve    Refrigerate  THE DAY OF YOUR PROCEDURE      DATE: 2/10/2011DAY:THURSDAY  Beginning at 9:30a.m. (5 hours before procedure):         1. Every 15 minutes, drink the solution down to the next mark (approx 8 oz) until the full liter is complete.  2. Follow completed prep with 16 oz. of clear liquid of your choice.    3. You may drink clear liquids until 12:30PM (2 HOURS BEFORE PROCEDURE).   MEDICATION INSTRUCTIONS  Unless otherwise instructed, you should take regular prescription medications with a small sip of water   as early as possible the morning of your procedure.  OTHER INSTRUCTIONS  You will need a responsible adult at least 74 years of age to accompany you and drive you home.   This person must remain in the waiting room during your procedure.  Wear loose fitting clothing that is easily removed.  Leave jewelry and other valuables at home.  However, you may wish to bring a book to read or  an iPod/MP3 player to listen to music as you wait for your procedure to start.  Remove all body piercing jewelry and leave at home.  Total time from sign-in until discharge is approximately 2-3 hours.  You should go home directly after your procedure and rest.  You can resume normal activities the  day after your procedure.  The day of your procedure you should not:   Drive   Make legal decisions   Operate machinery   Drink alcohol   Return to work  You will receive specific instructions about eating, activities and medications before you leave.    The above instructions have been reviewed and explained to me by   _______________________    I fully understand and can verbalize these  instructions _____________________________ Date _________

## 2010-03-17 NOTE — Procedures (Signed)
Summary: Colonoscopy  Patient: Tyler Beard Note: All result statuses are Final unless otherwise noted.  Tests: (1) Colonoscopy (COL)   COL Colonoscopy           DONE     [facility]     [address]     [place], [place]  [address]           COLONOSCOPY PROCEDURE REPORT           PATIENT:  Tyler Beard, Tyler Beard  MR#:  [id number]     BIRTHDATE:  1934/02/18, 74 yrs. old  GENDER:  male           ENDOSCOPIST:  [name]. [name], MD     Referred by:           PROCEDURE DATE:  03/27/2009     PROCEDURE:  Colonoscopy, Diagnostic     ASA CLASS:  Class III     INDICATIONS:  family history of colon cancer - father; iron     deficiency anemia           MEDICATIONS:   Fentanyl 50 mcg IV, Versed 5 mg IV           DESCRIPTION OF PROCEDURE:   After the risks benefits and     alternatives of the procedure were thoroughly explained, informed     consent was obtained.  Digital rectal exam was performed and     revealed no abnormalities.   The LB CF-H180AL [id number] endoscope     was introduced through the anus and advanced to the cecum, which     was identified by both the appendix and ileocecal valve, limited     by poor preparation.  Large amount of retained thick liquid stool     The quality of the prep was poor, using MoviPrep.  The instrument     was then slowly withdrawn as the colon was fully examined.     <<PROCEDUREIMAGES>>           FINDINGS:  A normal appearing cecum, ileocecal valve, and     appendiceal orifice were identified. The ascending, hepatic     flexure, transverse, splenic flexure, descending, sigmoid colon,     and rectum appeared unremarkable (see image2, image4, image5,     image6, image7, image9, image10, image12, image13, image14, and     image16).   Retroflexed views in the rectum revealed no     abnormalities.    The scope was then withdrawn from the patient     and the procedure completed.           COMPLICATIONS:  None           ENDOSCOPIC IMPRESSION:     1) Normal colon     RECOMMENDATIONS:     1) Return to the care of your primary provider. GI follow up as     needed.  Due to comorbidities, will not due f/u colonoscopy for     screening.           REPEAT EXAM:  No           ______________________________     [name]. [name], MD           CC:  Karlene Einstein, MD           n.     Rosalie DoctorBarbette Hair. Ameris Akamine at 03/27/2009 03:50 PM           Doyce Loose, [id number]  Note: An exclamation mark (!) indicates a result that was not dispersed into the flowsheet. Document Creation Date: 03/27/2009 3:50 PM _______________________________________________________________________  (1) Order result status: Final Collection or observation date-time: 03/27/2009 15:44 Requested date-time:  Receipt date-time:  Reported date-time:  Referring Physician:   Ordering Physician: Melvia Heaps [phone]) Specimen Source:  Source: Launa Grill Order Number: [id number] Lab site:

## 2010-05-01 LAB — DIFFERENTIAL
Eosinophils Absolute: 0 10*3/uL (ref 0.0–0.7)
Eosinophils Relative: 0 % (ref 0–5)
Lymphocytes Relative: 6 % — ABNORMAL LOW (ref 12–46)
Lymphs Abs: 0.9 10*3/uL (ref 0.7–4.0)
Monocytes Absolute: 0.6 10*3/uL (ref 0.1–1.0)
Monocytes Relative: 5 % (ref 3–12)

## 2010-05-01 LAB — COMPREHENSIVE METABOLIC PANEL
ALT: 11 U/L (ref 0–53)
AST: 13 U/L (ref 0–37)
Albumin: 3.1 g/dL — ABNORMAL LOW (ref 3.5–5.2)
CO2: 29 mEq/L (ref 19–32)
Calcium: 8.4 mg/dL (ref 8.4–10.5)
Chloride: 105 mEq/L (ref 96–112)
Creatinine, Ser: 0.96 mg/dL (ref 0.4–1.5)
GFR calc Af Amer: >60 mL/min (ref >60)
GFR calc non Af Amer: >60 mL/min (ref >60)
Sodium: 139 mEq/L (ref 135–145)

## 2010-05-01 LAB — URINALYSIS, ROUTINE W REFLEX MICROSCOPIC
Bilirubin Urine: NEGATIVE
Glucose, UA: NEGATIVE mg/dL
Leukocytes, UA: NEGATIVE
pH: 7.5 (ref 5.0–8.0)

## 2010-05-01 LAB — CBC
HCT: 34.4 % — ABNORMAL LOW (ref 39.0–52.0)
HCT: 43.9 % (ref 39.0–52.0)
Hemoglobin: 11.4 g/dL — ABNORMAL LOW (ref 13.0–17.0)
Hemoglobin: 11.4 g/dL — ABNORMAL LOW (ref 13.0–17.0)
Hemoglobin: 11.5 g/dL — ABNORMAL LOW (ref 13.0–17.0)
MCH: 30 pg (ref 26.0–34.0)
MCH: 30 pg (ref 26.0–34.0)
MCH: 30.1 pg (ref 26.0–34.0)
MCH: 30.1 pg (ref 26.0–34.0)
MCHC: 33.1 g/dL (ref 30.0–36.0)
MCHC: 33.1 g/dL (ref 30.0–36.0)
MCHC: 33.3 g/dL (ref 30.0–36.0)
MCV: 91 fL (ref 78.0–100.0)
MCV: 91.2 fL (ref 78.0–100.0)
Platelets: 214 10*3/uL (ref 150–400)
RBC: 3.79 MIL/uL — ABNORMAL LOW (ref 4.22–5.81)
RBC: 4.81 MIL/uL (ref 4.22–5.81)
RDW: 14.6 % (ref 11.5–15.5)
RDW: 14.8 % (ref 11.5–15.5)
WBC: 14.2 10*3/uL — ABNORMAL HIGH (ref 4.0–10.5)

## 2010-05-01 LAB — BASIC METABOLIC PANEL
BUN: 5 mg/dL — ABNORMAL LOW (ref 6–23)
BUN: 7 mg/dL (ref 6–23)
CO2: 27 mEq/L (ref 19–32)
CO2: 28 mEq/L (ref 19–32)
CO2: 28 mEq/L (ref 19–32)
Calcium: 8.8 mg/dL (ref 8.4–10.5)
Chloride: 100 mEq/L (ref 96–112)
Creatinine, Ser: 0.81 mg/dL (ref 0.4–1.5)
GFR calc Af Amer: >60 mL/min (ref >60)
GFR calc non Af Amer: >60 mL/min (ref >60)
Glucose, Bld: 94 mg/dL (ref 70–99)
Glucose, Bld: 98 mg/dL (ref 70–99)
Potassium: 3.8 mEq/L (ref 3.5–5.1)
Potassium: 4 mEq/L (ref 3.5–5.1)
Sodium: 138 mEq/L (ref 135–145)

## 2010-05-01 LAB — MRSA PCR SCREENING: MRSA by PCR: NEGATIVE

## 2010-05-01 LAB — URINE MICROSCOPIC-ADD ON

## 2010-05-22 LAB — DIFFERENTIAL
Basophils Absolute: 0 10*3/uL (ref 0.0–0.1)
Basophils Relative: 0 % (ref 0–1)
Lymphocytes Relative: 11 % — ABNORMAL LOW (ref 12–46)
Monocytes Relative: 3 % (ref 3–12)
Neutro Abs: 8.1 10*3/uL — ABNORMAL HIGH (ref 1.7–7.7)
Neutrophils Relative %: 85 % — ABNORMAL HIGH (ref 43–77)

## 2010-05-22 LAB — BASIC METABOLIC PANEL
CO2: 29 mEq/L (ref 19–32)
Calcium: 9.5 mg/dL (ref 8.4–10.5)
Creatinine, Ser: 0.8 mg/dL (ref 0.4–1.5)
GFR calc Af Amer: >60 mL/min (ref >60)
GFR calc non Af Amer: >60 mL/min (ref >60)

## 2010-05-22 LAB — CBC
MCHC: 32.6 g/dL (ref 30.0–36.0)
RBC: 4.72 MIL/uL (ref 4.22–5.81)
RDW: 14.9 % (ref 11.5–15.5)

## 2010-05-24 LAB — DIFFERENTIAL
Basophils Absolute: 0 10*3/uL (ref 0.0–0.1)
Basophils Relative: 0 % (ref 0–1)
Monocytes Absolute: 0.6 10*3/uL (ref 0.1–1.0)
Neutro Abs: 12.5 10*3/uL — ABNORMAL HIGH (ref 1.7–7.7)
Neutrophils Relative %: 86 % — ABNORMAL HIGH (ref 43–77)

## 2010-05-24 LAB — COMPREHENSIVE METABOLIC PANEL
ALT: 11 U/L (ref 0–53)
ALT: 9 U/L (ref 0–53)
AST: 15 U/L (ref 0–37)
Albumin: 3 g/dL — ABNORMAL LOW (ref 3.5–5.2)
Albumin: 3.3 g/dL — ABNORMAL LOW (ref 3.5–5.2)
Alkaline Phosphatase: 71 U/L (ref 39–117)
BUN: 2 mg/dL — ABNORMAL LOW (ref 6–23)
Calcium: 8.5 mg/dL (ref 8.4–10.5)
Chloride: 104 mEq/L (ref 96–112)
GFR calc Af Amer: >60 mL/min (ref >60)
Glucose, Bld: 101 mg/dL — ABNORMAL HIGH (ref 70–99)
Potassium: 3.3 mEq/L — ABNORMAL LOW (ref 3.5–5.1)
Sodium: 141 mEq/L (ref 135–145)
Total Bilirubin: 1 mg/dL (ref 0.3–1.2)
Total Protein: 6 g/dL (ref 6.0–8.3)

## 2010-05-24 LAB — CBC
HCT: 36.2 % — ABNORMAL LOW (ref 39.0–52.0)
HCT: 37.1 % — ABNORMAL LOW (ref 39.0–52.0)
Hemoglobin: 11.6 g/dL — ABNORMAL LOW (ref 13.0–17.0)
Hemoglobin: 12.2 g/dL — ABNORMAL LOW (ref 13.0–17.0)
Hemoglobin: 12.2 g/dL — ABNORMAL LOW (ref 13.0–17.0)
Hemoglobin: 12.2 g/dL — ABNORMAL LOW (ref 13.0–17.0)
Hemoglobin: 13.3 g/dL (ref 13.0–17.0)
MCHC: 32.9 g/dL (ref 30.0–36.0)
MCHC: 33.4 g/dL (ref 30.0–36.0)
MCHC: 33.6 g/dL (ref 30.0–36.0)
MCHC: 33.7 g/dL (ref 30.0–36.0)
MCHC: 33.7 g/dL (ref 30.0–36.0)
MCV: 87.8 fL (ref 78.0–100.0)
MCV: 88.3 fL (ref 78.0–100.0)
Platelets: 187 10*3/uL (ref 150–400)
Platelets: 206 10*3/uL (ref 150–400)
RBC: 3.92 MIL/uL — ABNORMAL LOW (ref 4.22–5.81)
RBC: 4.1 MIL/uL — ABNORMAL LOW (ref 4.22–5.81)
RBC: 4.48 MIL/uL (ref 4.22–5.81)
RDW: 15 % (ref 11.5–15.5)
RDW: 15.2 % (ref 11.5–15.5)
RDW: 15.2 % (ref 11.5–15.5)
RDW: 15.4 % (ref 11.5–15.5)
RDW: 15.4 % (ref 11.5–15.5)
WBC: 8.3 10*3/uL (ref 4.0–10.5)

## 2010-05-24 LAB — BASIC METABOLIC PANEL
BUN: 1 mg/dL — ABNORMAL LOW (ref 6–23)
BUN: 3 mg/dL — ABNORMAL LOW (ref 6–23)
CO2: 25 mEq/L (ref 19–32)
CO2: 25 mEq/L (ref 19–32)
CO2: 30 mEq/L (ref 19–32)
Calcium: 8.4 mg/dL (ref 8.4–10.5)
Calcium: 9.1 mg/dL (ref 8.4–10.5)
Chloride: 105 mEq/L (ref 96–112)
Chloride: 105 mEq/L (ref 96–112)
Creatinine, Ser: 0.62 mg/dL (ref 0.4–1.5)
GFR calc Af Amer: >60 mL/min (ref >60)
GFR calc non Af Amer: >60 mL/min (ref >60)
Glucose, Bld: 103 mg/dL — ABNORMAL HIGH (ref 70–99)
Glucose, Bld: 94 mg/dL (ref 70–99)
Glucose, Bld: 96 mg/dL (ref 70–99)
Potassium: 2.9 mEq/L — ABNORMAL LOW (ref 3.5–5.1)
Sodium: 136 mEq/L (ref 135–145)
Sodium: 140 mEq/L (ref 135–145)

## 2010-05-24 LAB — HEMOGLOBIN AND HEMATOCRIT, BLOOD: HCT: 41.8 % (ref 39.0–52.0)

## 2010-05-24 LAB — CULTURE, BLOOD (ROUTINE X 2)
Culture: NO GROWTH
Culture: NO GROWTH

## 2010-05-24 LAB — URINALYSIS, ROUTINE W REFLEX MICROSCOPIC
Bilirubin Urine: NEGATIVE
Ketones, ur: 40 mg/dL — AB
Nitrite: NEGATIVE
Specific Gravity, Urine: 1.025 (ref 1.005–1.030)
Urobilinogen, UA: 1 mg/dL (ref 0.0–1.0)

## 2010-05-24 LAB — URINE MICROSCOPIC-ADD ON

## 2010-05-25 LAB — URINALYSIS, ROUTINE W REFLEX MICROSCOPIC
Leukocytes, UA: NEGATIVE
Protein, ur: 30 mg/dL — AB
Specific Gravity, Urine: 1.024 (ref 1.005–1.030)
Urobilinogen, UA: 1 mg/dL (ref 0.0–1.0)

## 2010-05-25 LAB — COMPREHENSIVE METABOLIC PANEL
Albumin: 4 g/dL (ref 3.5–5.2)
Alkaline Phosphatase: 86 U/L (ref 39–117)
CO2: 25 mEq/L (ref 19–32)
Calcium: 9.7 mg/dL (ref 8.4–10.5)
GFR calc Af Amer: >60 mL/min (ref >60)
Sodium: 139 mEq/L (ref 135–145)
Total Bilirubin: 1.3 mg/dL — ABNORMAL HIGH (ref 0.3–1.2)

## 2010-05-25 LAB — CEA: CEA: 2.9 ng/mL (ref 0.0–5.0)

## 2010-05-25 LAB — GASTRIC OCCULT BLOOD (1-CARD TO LAB): Occult Blood, Gastric: POSITIVE — AB

## 2010-05-25 LAB — CBC
MCHC: 32.1 g/dL (ref 30.0–36.0)
RBC: 5.32 MIL/uL (ref 4.22–5.81)
WBC: 12.6 10*3/uL — ABNORMAL HIGH (ref 4.0–10.5)

## 2010-05-25 LAB — URINE MICROSCOPIC-ADD ON

## 2010-05-25 LAB — PROTIME-INR
INR: 1.1 (ref 0.00–1.49)
Prothrombin Time: 14.8 seconds (ref 11.6–15.2)

## 2010-05-25 LAB — APTT: aPTT: 34 seconds (ref 24–37)

## 2010-06-30 NOTE — Discharge Summary (Signed)
NAME:  Tyler Beard, Tyler Beard NO.:  [id number]   MEDICAL RECORD NO.:  [id number]          PATIENT TYPE:  INP   LOCATION:  3742                         FACILITY:  [facility]   PHYSICIAN:  Gaspar Garbe, M.D.DATE OF BIRTH:  Jul 17, 1934   DATE OF ADMISSION:  08/14/2008  DATE OF DISCHARGE:  08/20/2008                               DISCHARGE SUMMARY   The patient will be going to a nursing facility.   FINAL DIAGNOSES:  1. Aspiration pneumonia.  2. Upper gastrointestinal bleed secondary to esophagitis.  3. Dysphagia, chronic.  4. 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  5. Impaired glucose tolerance.  6. Hypertension.  7. Hyperlipidemia.  8. History of depression.  9. Reflux disease.  10.Duodenal polyp followed by Dr. [name] and now Dr. Juanda Chance in the      hospital.   MEDICATIONS ON DISCHARGE:  1. Augmentin 875 mg p.o. b.i.d. x20 total doses.  2. Potassium chloride 40 mEq p.o. daily.  3. Protonix 40 mg p.o. daily, may substitute with Prilosec 20 mg twice      daily for this.  4. Zocor 40 mg p.o. daily.  5. Zoloft 50 mg p.o. daily.  6. Toprol-XL 12.5 mg twice daily, this is currently held and should be      restarted at the facility if the patient develops hypertension.  7. Colace tablet twice daily p.r.n. constipation.  8. Dulcolax p.o. or suppository p.r.n. constipation.  9. Tylenol 500 mg q.6 h. p.r.n. pain or fever.  10.Thick-It Food Thickener for all meals.   STUDIES:  The patient had a swallowing study on August 16, 2008, which  indicated the need for dysphagia-type diet.  Chest x-ray on August 15, 2008, showed patchy left lower lung airspace disease, suggesting  pneumonia which was related to aspiration.  A CAT scan of the pelvis and  abdomen done on August 14, 2008, did not show any evidence of acute  obstruction, stable prostatic enlargement, normal appearance of the  appendix,  periosteal thickening of the gastric antrum, the area where he  is known to have a polyp before.   PROCEDURES:  The patient underwent endoscopy per Dr. Lina Sar, being  esophagitis present in the distal esophagus, pedunculated polyp in the  second portion of the duodenum, hiatal hernia, and shallow ulcer.  No  evidence of obstruction.  Also bleeding is likely initiated in the  distal esophagus.  Histology shows villous adenoma with focal high-grade  dysplasia.  No evidence of overt cancer.   Laboratory tests on date of discharge show sodium 140, potassium 3.4  which was replaced, BUN 1, creatinine 0.6.  CBC stabilized with a white  count of 8.1, hemoglobin 12.2, hematocrit 36.5.  Initial CBC showed a  hemoglobin of 15.3.  Protimes were normal.  CEA was normal at 2.9.  The  patient's TSH was normal at 0.7.  The patient had unremarkable UAs.  There was trace of blood evident of infection.   PHYSICAL EXAMINATION ON DATE OF DISCHARGE:  VITAL SIGNS:  Temperature  99.5, pulse 70, respiratory rate 18, blood pressure 122/81, sating 96%  on room air.  GENERAL:  No acute distress.  HEENT:  Normocephalic, atraumatic.  Peripheralization secondary to CVA.  This has been chronic over the past 2 years.  NECK:  Supple.  No lymphadenopathy.  LUNGS:  Slight upper expiratory wheezes, otherwise base is clear.  HEART:  Regular rate and rhythm.  ABDOMEN:  Soft, nontender.  Normoactive bowel sounds.  The patient has a  scar from prior PEG tube insertion site which appears to be clean.  EXTREMITIES:  No clubbing, cyanosis, or edema.  NEUROLOGIC:  The patient has some dysphonia, weakness consistent with  stroke as noted above.   SUMMARY OF HOSPITAL COURSE:  Mr. Vanderhoof was admitted to the hospital  after several days of not being able to eat per his wife, has been  having nausea and vomiting.  Of note, history had initially began when  he returned back to [facility] and was not notified that this would   start to receive Home Health letters.  The patient's wife was under the  assumption that the nursing home was not providing any considerable care  for him and that he could be cared for at home.  She indicates now that  she knows that this was not correct and she is not capable of caring for  him at home given the amount of direct care that he needs.  He  subsequently did poorly, bad p.o. appetite, and retching.  He is not on  dysphagia diet at this time and was brought into the emergency room with  mild upper GI bleed.  GI consultation was undertaken because there were  some questions at [facility] as to whether he had a severe stricture  per the wife.  Documentation of this was not ever found and Dr. Juanda Chance  performed an EGD, which showed a high-grade polyp, but CAT scan imaging,  endoscopy, and plain film x-rays failed to show any overt obstruction.  Polyp was removed.  The patient was given a swallowing study and placed  on dysphagia-type diet with [employer], which was almost  the same diet he was on prior to heading to [facility] per my  instructions.  Discussion was undertaken with family and the wife  indicated that she could no longer take care of him.  Nursing home  placement is currently being arranged at this time as per the patient  wanting to go home.  She simply cannot provide care even with the  resources she has for him.  We all feel that this is the best place for  him at this time.  The patient is discharged in good condition.      Gaspar Garbe, M.D.  Electronically Signed     RWT/MEDQ  D:  08/20/2008  T:  08/20/2008  Job:  [id number]

## 2010-06-30 NOTE — H&P (Signed)
NAME:  Tyler Beard, Tyler Beard NO.:  [id number]   MEDICAL RECORD NO.:  [id number]          PATIENT TYPE:  INP   LOCATION:  1404                         FACILITY:  [facility]   PHYSICIAN:  Larina Earthly, M.D.        DATE OF BIRTH:  06/11/1934   DATE OF ADMISSION:  04/02/2007  DATE OF DISCHARGE:                              HISTORY & PHYSICAL   CHIEF COMPLAINT:  Increased work of breathing and cough.   HISTORY OF THE PRESENT ILLNESS:  This is a 76 year old African American  male with a past medical history significant for multiple strokes with  left-sided residual hemiplegia, enteral feeding dependence, with a PEG  tube which has been removed in December 2008, now complicated by PEG  tube site infection, treated with oral antibiotics, history of pulmonary  embolism, status post IVC filter placement, duodenal adenoma with  partial obstruction, followed by Dr. [name].  The patient resides at  home with his wife, who provides 24-hour care 7 days a week with the  help of a CNA.  The patient is on a soft diet with thickened liquids at  home.  However, it is unclear whether he may have been getting some thin  liquids at times but not from the wife.   The patient developed a wheezing, cough and increased work of breathing  after eating on March 31, 2007.  Symptoms progressed on April 01, 2007, and the wife wanted to take the patient to the emergency room.  However the patient refused.  Symptoms progressed yet again on this  morning, with chest pain, chills, cough, and wheezing, but with no  fevers, blood per sputum, nausea, or vomiting.  The patient presented to  the emergency room via EMS.  En route, the patient did get albuterol  nebulizers, with some improvement in his work of breathing.  In the ER,  the patient continued to wheeze, albeit with some clinical improvement,  and chest x-ray revealed questionable early aspiration, and the patient  was started on azithromycin and  Rocephin, and I was called to admit the  patient, especially given the patient's hypoxia.   REVIEW OF SYSTEMS:  Negative for fevers, chills, but positive for  increased work of breathing, cough, audible wheezing, questionable chest  pain that is bilateral.  Negative for nausea, vomiting, diarrhea,  negative for abdominal pain.  Negative for new neurologic or  musculoskeletal deficits, and negative for a change in the patient's  dysarthria.   PROBLEM LIST:  1. Multiple cerebrovascular accidents, with residual left-sided      hemiplegia.  2. Pulmonary embolism, status post inferior vena cava filter      placement.  3. History of ileus.  4. History of colonic polypectomy, with high-grade dysplasia, no      cancer.  5. History of nausea, vomiting, and diarrhea.  6. History of tubulovillous adenoma of the duodenum, with partial      obstruction, followed conservatively by Dr. [name]. 7.  History of      recent enteral feeding tube site infection, treated with Keflex,      with  PEG tube report removed in December 2008, given adequate      caloric intake orally with modified diet.  7. History of right cerebellar hemorrhage.  8. History of dysphagia and expressive aphasia.   SOCIAL HISTORY:  The patient is married and is supported a significantly  by the patient's wife.  No history of recent tobacco or alcohol abuse.   FAMILY HISTORY:  Not pertinent at this time.   CURRENT MEDICATIONS:  Include, as best can be ascertained:  1. Metoprolol 25 mg p.o. b.i.d.  2. Prilosec 20 mg each day.  3. Keflex, ordered by Dr. [name].  4. Reglan 5 mg p.o. t.i.d.  5. Simvastatin, dose unknown.  6. Trazodone, dose unknown.  7. Sertraline, dose unknown.  8. Some sort of MiraLax-type bowel regimen, per the wife's report.   NO KNOWN DRUG ALLERGIES.   CURRENT LABORATORIES:  Chest x-ray reveals of bibasilar linear  opacities, consistent with atelectasis or early pneumonia.  Sodium 135,  potassium 3.5,  carbon dioxide 26, glucose 130, BUN 7, creatinine 0.84,  calcium 9.4.  White blood cell count 9.3, hemoglobin 13.2, hematocrit  39.7%, platelet count 216.   PHYSICAL EXAMINATION:  GENERAL:  We have an [place] American male lying  in bed, in no apparent distress, with some dysarthria which is unchanged  per the wife's report, and with some audible wheezing.  VITAL SIGNS:  Blood pressure 126/72, pulse 86, respirations 14, oxygen  saturation 95% on 2 liters by nasal cannula, temperature 97.9 degrees  Fahrenheit.  HEENT:  Sclerae anicteric.  Extraocular movements appear intact.  Face  is mildly asymmetrical, with respect to the nasolabial folds.  Tongue  does deviate slightly.  There are no oropharyngeal lesions.  NECK:  Supple.  There is no cervical lymphadenopathy.  LUNGS:  Reveal expiratory wheezes bilaterally, with mild rhonchi at the  bases.  CARDIOVASCULAR:  Reveals a regular rate and rhythm.  ABDOMEN:  Reveals a soft, nontender, nondistended abdomen.  Bowel sounds  are present.  Old PEG tube site has a scant amount of yellow  serosanguineous discharge, with no induration or surrounding erythema.  EXTREMITIES:  Reveal trace edema.  Both pedal pulses are intact.  There  is a residual left-sided hemiplegia present, but there is trace movement  of the left lower extremity.   ASSESSMENT AND PLAN:  1. Probable aspiration pneumonia in the setting of dysphasia, and      questionable noncompliance with thin liquid precautions.  We will      keep the patient n.p.o. and provide IV fluids, get a speech therapy      consult, and provide IV antibiotics consisting of azithromycin and      Rocephin which was initiated in the emergency room.  2. Percutaneous endoscopic gastrostomy tube site infection, followed      by Dr. [name].  Broad-spectrum antibiotics given for pneumonia      should cover this area.  3. Deep venous thrombosis prophylaxis.  We will defer, given history      of cerebral  hemorrhage, and will give TED hose.  4. Recent multiple strokes, with residual left-sided hemiplegia.      Stable per exam from old records, as well as the patient's wife's      report.  Will continue to need 24/7 care.  5. Tubulovillous duodenal adenoma, with partial obstruction, followed      by Dr. [name].  If the patient has      recurrent issues with nausea, vomiting, or evidence of  obstruction,      will need to get Dr. Marzetta Board consultation.  6. Do Not Resuscitate code status discussed with both the patient and      wife.  However, the patient's understanding of end of life issues      is quite limited, and given differing views between the two, will      defer to Dr. Wylene Simmer.      Larina Earthly, M.D.  Electronically Signed     RA/MEDQ  D:  04/02/2007  T:  04/04/2007  Job:  [id number]   cc:   Gaspar Garbe, M.D.  Fax: [phone]   [name]. [name], MD,FACG  [address]  [place]  [place] [address]

## 2010-06-30 NOTE — Discharge Summary (Signed)
NAME:  Tyler Beard, Tyler Beard NO.:  [id number]   MEDICAL RECORD NO.:  [id number]          PATIENT TYPE:  INP   LOCATION:  1404                         FACILITY:  [facility]   PHYSICIAN:  Gaspar Garbe, M.D.DATE OF BIRTH:  1934/12/03   DATE OF ADMISSION:  04/02/2007  DATE OF DISCHARGE:  04/04/2007                               DISCHARGE SUMMARY   DIAGNOSES:  1. Aspiration pneumonia.  2. Status post stroke with left-sided weakness and dysphasia.  3. History of PEG tube with localized cellulitis.  4. Depression.  5. Hyperlipidemia.  6. Gastroesophageal reflux disease.  7. Poor gastrointestinal motility.  8. Anxiety.   DISCHARGE MEDICATIONS:  1. Zoloft 100 mg once daily at bedtime.  2. Reglan 5 mg p.o. b.i.d.  3. Prilosec 20 mg daily.  4. Simvastatin 80 mg once daily.  5. Trazodone 50 mg daily at bedtime.  6. Augmentin 875 mg twice daily x1 week.  7. Prednisone 40 mg once daily x1 week.  8. Thick-It food thickener with all meals.  9. The patient is to be on dysphagia II diet with chin tucks and      observation while eating.  The family is to have suction bulb      nearby in case he has difficulty swallowing.   DISCHARGE PHYSICAL EXAM:  VITAL SIGNS:  Temperature 98.5, blood pressure  121/83, pulse 82, respiratory rate 20, satting 94% on room air.  GENERAL:  No acute distress.  HEENT:  Normocephalic, atraumatic.  PERRLA.  EOMI.  ENT is within normal  limits.  NECK:  Supple.  No lymphadenopathy, JVD or bruit.  HEART:  Regular rate and rhythm.  No murmur, rub or gallop.  LUNGS:  Clear to auscultation bilaterally.  The patient has expiratory  wheezes which are mostly upper airway sounds.  ABDOMEN:  Soft, nontender.  The patient has an area of some mild  cellulitis status post his PEG site.  NEUROLOGICAL:  The patient is oriented to person, place and time.  He  has a left-sided weakness as is chronic for him and some difficulty  being able to speak in clear  sentences.  MUSCULOSKELETAL:  No joint deformities or tenderness noted.   LABORATORY RESULTS:  BMET on date of discharge shows sodium 140,  potassium 3.8, BUN and creatinine are 8 and 0.7 respectively.  Calcium  normal at 9.1.  CBC - white count of 7.5, hemoglobin 12.4, platelets  193.  The patient was ruled out for MI with normal cardiac enzymes and  elevated CK of 248 but troponin was 0.03 and never rose throughout his  stay.   RADIOLOGY:  The patient had chest x-ray February 15 which showed linear  opacities in lung bases showing possible subsegmental atelectasis or  worsening infiltrate.  The patient also had a portable chest x-ray on  February 17 which showed some worsening of these infiltrates.   HOSPITAL COURSE:  Mr. Dimichele was admitted to the hospital by my partner,  Dr. Waynard Edwards, through the emergency room.  It was noted that he was eating  some things that were not on his dysphagia diet and  subsequently was  thought to have had an aspiration.  His breathing became more labored  and he had considerable amounts of wheezing over the course of the first  24 hours.  He was put on telemetry, ruled out for MI and treated with  albuterol and IV steroids and assessed on day #2 by speech and  swallowing.  They performed a modified barium swallow indicating some  aspiration for thin liquids and recommended dysphagia II diet with chin  tuck.  He was able to tolerate this the evening of day 2 and breakfast  day 3.  The patient's area of cellulitis remained the same.  He  tolerated IV Rocephin and azithromycin very well and was transitioned  onto Augmentin orally.  His oral medications were resumed once he was  able to swallow after being seen by speech therapy.  The patient was  told that he needed to be strict with his diet and could not test the  waters every once in a while to see whether he could swallow certain  things as has been the pattern in the past and indicated that this could   in fact cause him further aspiration and even lead to death if he is not  able to control his airway as he is status post stroke.  The patient is  to follow up with me in the next 2 weeks.  He is to be followed by home  health nursing until that time and is to resume his normal physical  therapy.  The patient's wife was informed of the above before his  discharge and nursing staff went over his dysphagia diet before his  discharge.   CONDITION ON DISCHARGE:  Stable.      Gaspar Garbe, M.D.  Electronically Signed     RWT/MEDQ  D:  04/04/2007  T:  04/05/2007  Job:  [id number]

## 2010-06-30 NOTE — H&P (Signed)
NAME:  Tyler Beard, Tyler Beard NO.:  [id number]   MEDICAL RECORD NO.:  [id number]          PATIENT TYPE:  INP   LOCATION:  3742                         FACILITY:  [facility]   PHYSICIAN:  Gaspar Garbe, M.D.DATE OF BIRTH:  1935-01-21   DATE OF ADMISSION:  08/14/2008  DATE OF DISCHARGE:                              HISTORY & PHYSICAL   CHIEF COMPLAINT:  Failure thrive, nausea, and vomiting.   HISTORY OF PRESENT ILLNESS:  The patient is a 76 year old black male,  known to me after establishment in our practice several years back.  He  had had a CVA with left-sided weakness at that time and had been  followed by Dr. [name] for a benign abdominal tumor.  The patient has  been in a nursing facility in [facility] since March 2009 and was  without his feeding tube during that time.  Wife decided that he did not  need nursing home care anymore and took him home about 3 weeks ago.  First, I knew that Tyler Beard was out of the nursing home when I started  to receive notices from local home health agencies requiring signatures,  as I had not seen Tyler Beard, we had him in the office last week.  No  records from his nursing facility came, and the patient's wife was  unable to offer a lot of other information on him. Tyler Beard has a  slight expressive aphasia and often does not provide his own history as  well as the case on that office visit.  She indicated that he was having  some difficulty with some nausea and he had been out of Reglan, and  required to go back on that for which I wrote a prescription.  He had  been home for another week during which time, we have had multiple calls  from home health requiring extra help, requiring issues regarding blood  pressures, which were essentially slightly above normal and the  continued nausea and vomiting.  The patient came to the emergency room  today nausea, vomiting, and some coffee-ground type emesis.  His testing  Gastroccult  positive, but his abdominal films did not show any evidence  of ileus.  I was asked to admit the patient.   When talking with the patient's wife, she indicated that the benign  tumor had grown in size where only a pencil could pass through it and  that he had been offered surgery.  She also indicated that Tyler Beard  declined surgery because I had never done that kind in [place]  before, none of this had been made known to me at his last week's visit.  He is clearly uncomfortable, but not providing a lot of history on his  own at this point.  We allowed discussion about his increased level of  care at home and that she had not been ready to purely take him home and  that he requires a nursing facility long term and despite her best  efforts, he is clearly clinically failed not to mention he has an  ongoing medical condition for which,  none of his local providers is  aware of the workup done in [place].   ALLERGIES:  No known drug allergies.   MEDICATIONS:  1. Reglan 5 mg a.c.  2. Bowel preps includes Senokot and MiraLax.  3. Zocor 40 mg per day.  4. Prilosec 20 mg per day.  5. Zoloft 75 mg per day.  6. Toprol-XL 12.5 mg twice daily.  7. Phenergan 25 mg as needed.  8. Calcium and vitamin D and multivitamin.   PAST MEDICAL HISTORY:  1. The patient had a stroke with left-sided weakness and an aphasia.  2. Impaired glucose tolerance, currently diet controlled.  3. Hypertension.  4. Hyperlipidemia.  5. Depression.  6. History of gastroesophageal reflux disease.  7. History of benign tumor followed by Dr. [name] locally and by Dr.      Dareen Piano presumably in [facility].   SOCIAL HISTORY:  The patient lives in [place] currently with his  wife, Tyler Beard.  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  He has 1 child.  He is a  nonsmoker and nondrinker.   FAMILY HISTORY:  Mother died at age 44 of emphysema.  Father died of  cancer at age 59.  He has a sister, who died of cancer as  well.   REVIEW OF SYSTEMS:  The patient is not able to state, but obviously is  having nausea, vomiting, and has had some right-sided chest pain for  which his cardiac enzymes are negative.  Otherwise, review of systems is  negative.   ADVANCED DIRECTIVES:  The patient is a full code.  He has been DNR in  the past.  Wife does not wishes to be the case currently.   PHYSICAL EXAMINATION:  VITAL SIGNS:  Temperature 98.2, pulse 96, blood  pressure 140/96, and sating 96% on room air.  GENERAL:  In no acute distress presently.  HEENT:  Normocephalic and atraumatic.  The patient has an NG tube, which  is currently draining some blood, just bright red.  ENT is within normal  limits.  NECK:  Supple.  No lymphadenopathy.  HEART:  Regular rate and rhythm.  LUNGS: Clear to auscultation bilaterally.  ABDOMEN:  Soft and nontender.  Normoactive bowel sounds.  No  hepatosplenomegaly is noted.  EXTREMITIES:  No clubbing, cyanosis, or edema.  NEUROLOGIC:  The patient has general expressive aphasia, has had  dysphagia in the past.  He has left-sided weakness.  MUSCULOSKELETAL:  No joint deformities noted.   IMAGING:  The patient had no evidence of the ileus on abdominal flat  plate.  CT abdomen and pelvis is currently pending.   LABORATORIES:  White count slightly elevated at 12.6, hemoglobin 15.3,  hematocrit 47.7, and platelets 275.  BUN and creatinine 10 and 0.7  respectively.  Glucose is slightly elevated at 125.  LFTs are within  normal limits.  CK is 65.  Troponin less than 0.01.   ASSESSMENT AND PLAN:  1. Gastrointestinal distress.  Most likely secondary to this benign      tumor, which is now probably caused a severe ileus, which was not      seen on a noncontrasted abdominal film.  We will get a CT of      abdomen and pelvis.  I have asked Dr. Regino Schultze practice to see the      patient as they are on-call today.  Tyler Moccasin, PA-C, has      informed me that Dr. Juanda Chance will most likely  be performing  endoscopy later this afternoon.  We will check H and H q.6 h., and      also add coags on.  Most likely culprit of all his problems is this      benign tumor, which is encouraged on his belly to the point where      he may require surgery.  I informed to both him and his wife of      that if they choose to do nothing, our options are extremely      limited being either a chemotherapeutic or radiation or palliative      care for him to undergo hospice care.  Dr. Juanda Chance will also see      whether he has a Mallory-Weiss tear or an ulcer from his upper      endoscopy.  I have put him on twice daily IV Protonix and I have      held all the rest of his oral medications at this time.  2. Hypertension, not currently an issue.  3. Hyperlipidemia.  No Zocor until he can be made non n.p.o.  4. Depression, stable.  5. Nausea and vomiting.  The patient will receive Zofran as needed.      Also, we will check his H and H q.6 h. and recheck his labs in the      morning as well as the TSH.      Gaspar Garbe, M.D.  Electronically Signed     RWT/MEDQ  D:  08/14/2008  T:  08/15/2008  Job:  [id number]   cc:   Hedwig Morton. Juanda Chance, MD

## 2010-06-30 NOTE — Assessment & Plan Note (Signed)
[facility]                         GASTROENTEROLOGY OFFICE NOTE   Tyler Beard, Tyler Beard                       MRN:          [id number]  DATE:04/19/2007                            DOB:          [birth year]    PROBLEM:  1. Duodenal mass.  2. Infection at PEG tube site.   Tyler Beard has returned for re-evaluation.  He continues to have minimal  drainage from the gastrostomy tube site.  This is an intermittent  problem.  His wife states that he does complain of nausea.  He has a  benign duodenal polyp, causing minimal obstruction.  She feels the  symptoms have been stable.   EXAM:  Pulse 68, blood pressure 110/70, weight 155.  There is, at the gastrostomy tube site, no fluctuance or tenderness.  There is a small amount of granulation tissue.   IMPRESSION:  1. Local gastrostomy tube site infection.  There is no active      infection, though there seems to be some occasional drainage.  2. Duodenal polyp.  At this point, his obstructive symptoms are quite      minimal (upper GI from December 2008 did not demonstrate      significant obstruction).   RECOMMENDATIONS:  1. Continue local care, including warm soaks.  2. Patient will return in six months.  I carefully instructed him and      his wife to contact me if symptoms of nausea and distention worsen,      at which point I will consider trying to debulk this polyp.     [name]. [name], MD,FACG  Electronically Signed    RDK/MedQ  DD: 04/19/2007  DT: 04/19/2007  Job #: [id number]

## 2010-06-30 NOTE — Assessment & Plan Note (Signed)
[facility]                         GASTROENTEROLOGY OFFICE NOTE   LOYCE, KLASEN                       MRN:          [id number]  DATE:03/29/2007                            DOB:          11-Feb-1935    PROBLEMS:  1. Black stool on March 27, 2007.  2. Question a PEG site infection.   HISTORY:  Mahlik is a 76 year old, African-American male patient of Dr.  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  The patient has a history of a previous CVA and has had a PEG  placed a couple of years ago.  This was removed by Dr. [name] at the  time of endoscopy, February 06, 2007.  The patient has been eating  without difficulty, but his wife says that on three separate occasions,  the PEG site has oozed some clear to yellowish material and she is  concerned that it has been infected.  She had called, was instructed to  clean the site with hydrogen peroxide and use Neosporin, and she says  that it cleared up twice within the past month, but then it recurs.  He  has not had any complaints of abdominal pain, no fever, chills, etc.  He  did have episode two days ago with diarrhea and had several black bowel  movements.  He has not had any bowel movement yesterday or today, and  again has been eating without difficulty, has no complaints of nausea or  vomiting or pain.  They have not been using any Pepto-Bismol, the  patient is not anticoagulated.   Last EGD done, February 06, 2007, shows a 5-cm mass in the duodenum in  the second portion causing partial obstruction, and a 6-cm hiatal  hernia.   CURRENT MEDICATIONS:  1. Metoprolol 25 daily.  2. Trazodone 50 q.h.s.  3. Reglan 5 p.o. t.i.d.  4. Zoloft, uncertain of the dosage, q.h.s.  5. Prilosec 40 mg daily.  6. Simvastatin daily, uncertain of the milligram dosage.  7. A vitamin supplement daily.   ALLERGIES:  No known drug allergies.   PHYSICAL EXAMINATION:  GENERAL:  A well-developed, African-American male  in no acute distress in a wheelchair.  He converses with short, but  appropriate answers.  VITAL SIGNS:  Weight is 140, blood pressure 108/74, pulse in the 70s.  CARDIOVASCULAR:  Regular rate and rhythm with S1 and S2, no murmur, rub,  or gallop.  PULMONARY:  Clear to A&P.  ABDOMEN:  Soft, bowel sounds are active, he is nontender, his PEG site  appears benign, there is no surrounding erythema or evidence of  cellulitis or fluctuance, is nontender, he does ooze small amounts of  creamy-appearing exudate from the center of the previous PEG site.  RECTAL EXAM:  Brown, hemoccult-negative stool.   IMPRESSION:  18. A 76 year old male with an episode of black stool two days ago.      Cannot rule out some transient melena; however, there is no      evidence of any active  bleeding currently.  2. Mild percutaneous endoscopic gastrostomy (PEG) site infection      locally.  3. Duodenal adenoma with partial obstruction being followed.   PLAN:  1. Check a CBC today.  2. The patient's wife asked multiple questions about plans for his      adenoma and what to look for, for bleeding, obstruction, etc.  We      had a long discussion today and will discuss with Dr. [name]      regarding next followup EGD.  3. Keflex 250 q.i.d. x7 days for PEG skin site infection.      Mike Gip, PA-C  Electronically Signed      Hedwig Morton. Juanda Chance, MD  Electronically Signed   AE/MedQ  DD: 03/29/2007  DT: 03/30/2007  Job #: [id number]   cc:   [name]. [name], MD,FACG

## 2010-06-30 NOTE — Assessment & Plan Note (Signed)
[facility]                         GASTROENTEROLOGY OFFICE NOTE   ROMA, BIERLEIN                       MRN:          [id number]  DATE:05/24/2007                            DOB:          [birth year]    PROBLEM:  Infection at gastrostomy tube site.   Tyler Beard has returned for followup of his G-tube site infection.  There has been minimal drainage and no pain.  Occasionally, he can  extrude some pus from the site.   ON EXAM:  Pulse 64, blood pressure 120/70, weight 155.  The gastrostomy tube site is clear, without fluctuance, tenderness or  redness.  There is no discharge.   IMPRESSION:  Resolving local infection at the G-tube site.   RECOMMENDATIONS:  Continue warm soaks.  No further therapy is required  at this point.     [name]. [name], MD,FACG  Electronically Signed    RDK/MedQ  DD: 05/24/2007  DT: 05/24/2007  Job #: [id number]

## 2010-07-03 NOTE — H&P (Signed)
NAME:  Tyler Beard, Tyler Beard NO.:  [id number]   MEDICAL RECORD NO.:  [id number]          PATIENT TYPE:  INP   LOCATION:  1403                         FACILITY:  [facility]   PHYSICIAN:  Jackie Plum, M.D.DATE OF BIRTH:  1934/08/25   DATE OF ADMISSION:  11/19/2005  DATE OF DISCHARGE:                                HISTORY & PHYSICAL   CHIEF COMPLAINT:  Vomiting.   HISTORY OF PRESENT ILLNESS:  The patient is a 76 year old African-American  gentleman who was discharged from the hospital just yesterday after  admission on November 16, 2005, during which time he presented with  intractable nausea and vomiting.  Apparently during admission, the patient  received supportive care with improvement in his symptoms and was discharged  home.  The patient was brought to the ED because of complaints of vomiting  of dark brown liquid and mucus around 8 o'clock this morning.  He also said  he had a lot of coughing.  He had another vomit around this afternoon.  The  wife was concerned that the patient may possibly have aspirated, as well as  concerns about possibly bleeding.  He was brought to the ED whereupon  blood work done indicated normal CBC.  Lab work indicated WBC count of 6.2,  hemoglobin 12.6, hematocrit 37.4, MCV 85.9, platelet count 292.  The  patient's hemoglobin was 11.1 yesterday prior to discharge.  There has not  been any drop in his hemoglobin. INR is 1.3, pro-time 16.4.  Chemistries  were done, which showed potassium 3.7.  BUN was 5 with a creatinine of 0.5.  Albumin was 3.3.  The last complete metabolic panel was within normal  limits.  His hemoccult was noted to be positive according to ED records.  The patient therefore admitted for further management of his nausea and  vomiting, which is recurrent, as well as possible hematochezia/melena.  Past  medical history, medication history, family history, social history, past  surgical history, as well as allergy history  are unchanged from that of H&P  done 3 days ago.   REVIEW OF SYSTEMS:  No chest pain.  No shortness of breath.  No abdominal  pain.  Review of systems was essentially unremarkable except for as noted  above.   PHYSICAL EXAMINATION:  VITAL SIGNS:  Blood pressure:  106/86.  Pulse: 76 .  Respirations:  18.  O2, 70% on room air.  Temperature:  97.3 degrees  Fahrenheit.  The patient was not in acute distress.  HEENT:  Normocephalic, atraumatic.  Pupils equal, round and reactive to  light.  Extraocular movements intact.  Oropharynx moist.  The patient has  mild scleral pallor without icterus.  LUNGS:  The patient has coarse breath sounds without any rhonchi or wheezes.  CARDIOVASCULAR:  Regular, no gallops or murmur.  ABDOMEN:  The patient had a clean __________ soft and nontender.  Bowel  sounds present.  EXTREMITIES:  No cyanosis.  The patient had trace edema noted.  CNS:  The patient is alert and moves all of his extremities.  No obvious new  acute or focal deficits noted.  LABS:  As above.   IMPRESSION:  1. Recurrent nausea and vomiting.  2. Heme positive stools.  The patient's stool Guaiac on admission on      November 16, 2005, was negative.   PLAN:  Admitted for monitoring and serial H&H.  I will put him on proton  pump inhibitors.  Possibly GI evaluation. Will put him on  antiemetics and  anti-nauseas.  Supportive therapy started and also will check KUB for  completeness sake.      Jackie Plum, M.D.  Electronically Signed     GO/MEDQ  D:  11/19/2005  T:  11/20/2005  Job:  [id number]

## 2010-07-03 NOTE — H&P (Signed)
NAME:  Tyler Beard, Tyler Beard NO.:  [id number]   MEDICAL RECORD NO.:  [id number]          PATIENT TYPE:  INP   LOCATION:  5152                         FACILITY:  [facility]   PHYSICIAN:  Lucita Ferrara, MD         DATE OF BIRTH:  1934/12/29   DATE OF ADMISSION:  11/16/2005  DATE OF DISCHARGE:                                HISTORY & PHYSICAL   HISTORY OF PRESENT ILLNESS:  The patient is a 76 year old male with a past  medical history significant for a recent CVA, which he had on Jul 05, 2005.  He comes in with intractable vomiting and coughing up mucus for the last  day.  Like I said, the patient had a CVA on Jul 05, 2005 which left him  paralyzed on the left side of the body.  The patient was apparently admitted  to a hospital in [facility] [place], where he stayed a month and  a half.  Per the wife, the patient did not benefit from physical therapy,  but turned out to deteriorate, became dehydrated, at which time the wife  decided to admit him to [facility] where they found him to have  blood clots in the legs.  Apparently at [facility], the patient was doing  better, actually got a filter for chronic DVTs, and his wife actually ended  up taking him home, and his wife actually says that he is feeling much  better and doing much better.  The wife actually attributes his recent  vomiting to a tumor in his small intestine, which was discovered during an  endoscopy at the hospital, at which point the patient was admitted in [place].   PAST MEDICAL HISTORY:  Significant for a CVA.   Dictation ended at this point.      Lucita Ferrara, MD     RR/MEDQ  D:  11/16/2005  T:  11/17/2005  Job:  [id number]

## 2010-07-03 NOTE — Assessment & Plan Note (Signed)
[facility]                                   ON-CALL NOTE   ERYC, BODEY                       MRN:          [id number]  DATE:09/27/2005                            DOB:          November 12, 1934    TIME OF CALL:  1732.   REASON FOR PHONE CALL:  I received a page through the answering service from  Kjuan Seipp at [phone] concerning her husband, Tyler Beard.  The  call stated Please call regarding patient.  I called Ms. Dazey back at  the number stated above.  She stated that she was concerned about her  husband's swallowing.  He could not keep the food down that we had told him  to take.  When I asked Ms. Specht if she realized she was speaking with  cardiology and did she need cardiology services, she stated no, she had  asked to speak to the gastroenterologist on call, not the cardiologist.  I  instructed her that I would take care of that.  I then hung up and called  the answering service back and spoke to the person who had spoken with Ms.  Clemon initially and instructed them that she needed the gastroenterologist  on call for [place]. They stated they would handle this.                                   Dorian Pod, ACNP   MB/MedQ  DD:  09/27/2005  DT:  09/28/2005  Job #:  [id number]

## 2010-07-03 NOTE — Discharge Summary (Signed)
NAME:  EDWAR, COE NO.:  [id number]   MEDICAL RECORD NO.:  [id number]          PATIENT TYPE:  INP   LOCATION:  6711                         FACILITY:  [facility]   PHYSICIAN:  Vanetta Mulders, MD         DATE OF BIRTH:  04/15/1934   DATE OF ADMISSION:  08/10/2005  DATE OF DISCHARGE:  08/23/2005                                 DISCHARGE SUMMARY   ADDENDUM:  At discharge, the patient is stable.  There are no changes in the  medication (see full dictation).  Vital signs at discharge show a  temperature of 98.4, a blood pressure of 179/79, a heart rate of 95,  respiratory rate of 18, O2 sat 98% on room air.  Labs show a sodium of 134,  potassium 3.9, chloride 98, bicarb 29, BUN 8, creatinine 0.5, amylase was  136, calcium 8.5, white count 13, hemoglobin 11.1 and platelets 303,000, PT  14.2 and INR 1.1.      Vanetta Mulders, MD  Electronically Signed     DA/MEDQ  D:  09/16/2005  T:  09/16/2005  Job:  [id number]

## 2010-07-03 NOTE — Consult Note (Signed)
NAME:  MARIEL, GAUDIN NO.:  [id number]   MEDICAL RECORD NO.:  [id number]          PATIENT TYPE:  INP   LOCATION:  1403                         FACILITY:  [facility]   PHYSICIAN:  Juan-Carlos Monguilod, M.D.DATE OF BIRTH:  07-Mar-1934   DATE OF CONSULTATION:  11/25/2005  DATE OF DISCHARGE:  11/26/2005                                   CONSULTATION   REFERRING PHYSICIAN:  Jackie Plum, M.D.   REASON FOR CONSULTATION:  To assist with goals of care. Consult performed by  Berkley Harvey, NP for Dr. Elliot Gurney.   This nurse practitioner met with the patient's wife and daughter at length  to discuss, options, prognosis and goals.   FAMILY GOALS:  1. DNR.  2. Continue tube feeds.  3. Home with Hospice.  4. No additional hospitalization  5. Comfort.   RECOMMENDATIONS:  1. The patient has not had a bowel movement for the last week.  I will      check for disimpaction add Senokot, and Dulcolax suppositories.  2. Prepare for discharge tomorrow. Hospice diagnosis will be failure to      thrive with esophageal mass.   IMPRESSION:  Failure to thrive with complicated esophageal mass and bed  bound comorbidities secondary to stroke.  High risk for aspiration and  infection and family goals at this point are comfort and dignity.  The  patient's prognosis is likely less than 6 months and hospice is appropriate  at this time.   Thank you for the opportunity to work with this patient and family. If you  have any questions or concerns please call Berkley Harvey, NP at [phone]  with any questions or concerns.   HISTORY OF PRESENT ILLNESS:  Mr. Brining is an unfortunate 76 year old  African-American male who was just recently discharged from the hospital on  October 2 secondary to nausea and vomiting.  The patient was brought back to  the ED again on October 5 with similar complaints as well as vomiting dark  brown liquid and mucus.  His wife reports that he has  continued to lose  weight over the last few months. On workup, the patient was found to have a  large esophageal mass and continued difficulty with nausea and vomiting.  I  was asked to meet with the patient and the patient's wife and daughter today  to discuss goals of care as the patient has continued to have failure to  thrive issues and the wife's goals are to not pursue aggressive treatment in  the face of terminal disease.   REVIEW OF SYSTEMS:  Received from medical records, family and nursing staff.  The patient denies any pain.  He is able to answer some yes/no questions.  He denies shortness of breath, nausea or vomiting.  The staff stated that he  has not had a bowel movement for 1 week and his wife states that he has a  history of needing to be disimpacted.  The patient has had some nausea and  vomiting over the last couple of weeks as well as intermittent episodes of  altered mental status.  PHYSICAL EXAM:  VITAL SIGNS:  BP 107/80, heart rate 82, respirations 18, O2  sat 98% on room air.  Temperature 97.1.  GENERAL:  The patient is in no acute distress interacting with his wife.  HEENT:  Head is normocephalic, atraumatic. Extraocular movements intact.  Oropharynx is moist.  LUNGS:  Clear, no rhonchi or wheezes.  ABDOMEN:  Soft, nontender, hypoactive bowel sounds.  CARDIOVASCULAR:  Heart rate regular.  No murmurs, rubs or gallops.  EXTREMITIES:  No cyanosis.  The patient has mild edema noted bilaterally.  CNS:  The patient is alert and oriented and moves all extremities.   PAST MEDICAL HISTORY:  Multiple strokes, history of secondary syphilis,  aspiration pneumonia, pulmonary embolism, oropharyngeal dysphasia, PEG tube,  bilateral DVTs, duodenal polyp, chronic constipation.   SOCIAL HISTORY:  The patient lives at home prior to admission with his wife.  She is hopeful to take him back there.  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 They deny any alcohol or tobacco use. They are  [place].   ALLERGIES:  No known drug allergies.   MEDICATIONS:  1. Lovenox 60 mg subcu q.12h.  2. Lasix 20 mg via tube daily.  3. Lovenox per pharmacy protocol.  4. Reglan 10 mg via tube q.8h.  5. Lopressor 25 mg via tube q.12h.  6. Protonix 40 mg via tube b.i.d.  7. Potassium 10 mEq via tube daily.  8. Zoloft 100 mg via tube daily.  9. Coumadin per pharmacy protocol.  10.Tylenol 650 mg via tube q.4h p.r.n.  11.Zofran 4 mg IV q.6h.  12.Phenergan 12.5 mg p.o. q.4h p.r.n.   LAB DATA:  White blood cell count 10.9, hematocrit 33.2. Sodium 139,  potassium 3.6, chloride 104, BUN 2, creatinine 0.5, albumin 3.3, total  protein 6.6, calcium 9.0, magnesium 1.8.  Cardiac enzymes negative.      Asencion Noble, NP      Rosanne Sack, M.D.  Electronically Signed    KMJ/MEDQ  D:  11/28/2005  T:  11/29/2005  Job:  [id number]   cc:   Jackie Plum, M.D.  [facility]

## 2010-07-03 NOTE — Discharge Summary (Signed)
NAME:  ANTAR, MILKS NO.:  [id number]   MEDICAL RECORD NO.:  [id number]          PATIENT TYPE:  INP   LOCATION:  6711                         FACILITY:  [facility]   PHYSICIAN:  Vanetta Mulders, MD         DATE OF BIRTH:  Jul 21, 1934   DATE OF ADMISSION:  08/10/2005  DATE OF DISCHARGE:                                 DISCHARGE SUMMARY   Audio too short to transcribe (less than 5 seconds)      Vanetta Mulders, MD     DA/MEDQ  D:  08/20/2005  T:  08/20/2005  Job:  [id number]

## 2010-07-03 NOTE — Assessment & Plan Note (Signed)
[facility]                           GASTROENTEROLOGY OFFICE NOTE   SAVVA, BEAMER                       MRN:          [id number]  DATE:11/02/2005                            DOB:          August 04, 1934    REFERRING PHYSICIAN:  Lorinda Creed, NP   REASON FOR CONSULTATION:  Constipation.   ASSESSMENT:  Vomiting, constipation, failure to thrive problems in a 76-year-  old black man status post multiple strokes.  He also apparently has a  history of a duodenal polyp that could have been high grade dysplasia versus  cancer discovered during endoscopy in [place], [place] this  spring.   RECOMMENDATIONS AND PLAN:  The patient is totally bed bound at this point.  He is in a wheelchair.  He is total lift.  I cannot really assess him  adequately. His abdomen is soft, his gastrostomy tube is intact. He can  provide no history. I have sent him over to the emergency room for  evaluation with x-rays and possible disimpaction or treatment and  observation admission if needed. He is an extremely poor candidate for  endoscopic evaluation. He is on Coumadin. I think his wife may have some  unrealistic expectations for the patient.   HISTORY:  This 76 year old man was admitted into the hospital in June and  July, and the discharge summary indicates he has had multiple strokes in the  posterior circular distribution, basilar artery stenosis, history of  secondary syphilis, aspiration pneumonia was suspected. He had an adynamic  ileus, he has history of pulmonary embolism bilaterally, he has  oropharyngeal dysphagia and a percutaneous endoscopic gastrostomy tube  placement in [facility]. He has bilateral DVTs. He had the duodenal polyp  issue as described above. He had a stroke when he was working down in [facility] and was there and then was brought home to [place]. Since that time  he has had difficulty with constipation, not responsive to  intermittent  stimulant laxatives. He has not moved his bowels well.  He may have had some  rectal bleeding. His wife is quite concerned about these problems.  Apparently, he did pass a swallowing study recently and was starting to eat,  but he is vomiting and regurgitating. He gives no history at all.   MEDICATIONS:  Are listed and reviewed in my chart.   PAST MEDICAL HISTORY:  As described above.   FAMILY HISTORY:  Listed and reviewed in the chart.   SOCIAL HISTORY:  Listed and reviewed in the chart.   REVIEW OF SYSTEMS:  Listed and reviewed in the chart.   PHYSICAL EXAMINATION:  A chronically ill, mute black man.  Height 5 feet 7 inches.  Weight estimated to be 130 pounds, but we cannot  weigh him. Blood pressure 102/78, pulse 88.  Eyes anicteric.  ENT:  Normal mouth mucosa and pharynx.  NECK:  Supple.  No thyromegaly or masses.  CHEST: Clear  HEART:  S1, S2.  No rubs, murmurs or gallops.  ABDOMEN: Soft, scaphoid, nontender.  Bowel sounds are present.  An intact  gastrostomy tube is in  place in left upper quadrant and looks to be without  problems.  NEUROLOGIC:  He is not moving any of his extremities spontaneously.  He is  mute. He does not respond to commands.                                   Iva Boop, MD,FACG   CEG/MedQ  DD:  11/03/2005  DT:  11/04/2005  Job #:  [id number]   cc:   Lorinda Creed, NP

## 2010-07-03 NOTE — Consult Note (Signed)
NAME:  Tyler Beard, WINEGARDEN NO.:  [id number]   MEDICAL RECORD NO.:  [id number]          PATIENT TYPE:  INP   LOCATION:  3308                         FACILITY:  [facility]   PHYSICIAN:  Genene Churn. Love, M.D.    DATE OF BIRTH:  06-Oct-1934   DATE OF CONSULTATION:  08/14/2005  DATE OF DISCHARGE:                                   CONSULTATION   This is the first [facility] admission for this 76 year old left  handed African-American male transferred from [facility] [place] August 10, 2005 for  evaluation and recuperation from stroke.   HISTORY OF PRESENT ILLNESS:  Tyler Beard has no known prior history of  stroke, hypertension, diabetes, heart disease or cigarette use.  The morning  Jul 05, 2005, he was found in his bedroom with urine present and vomitus  present, unable to talk.  He was seen at [facility] in [place], [place] where one notes that he had a right hemiparesis but a  consulting note from Dr. August Saucer indicates that the patient had a left  hemiparesis, aphasia and paresis of left conjugate gaze.  Initial CT scan  raised a question of a right thalamic stroke.  MRI study on Jul 11, 2005  showed evidence of multiple strokes acute involving the right basal ganglia,  the right cerebral peduncle, the right pons, some in the left pons and both  cerebellar hemispheres without any definite hemorrhage present as report on  the MRI.  MRI was done Jul 06, 2005.  Repeat CT scan on Jul 07, 2005 showed  multiple sites of infarction present.  An MRA study done with the MRI showed  evidence of basilar artery thrombosis.  Doppler studies of the carotids  showed that both carotids were normal.  He had a venous Doppler study which  showed no evidence of clots present.  He was not able to talk in the  hospital.  He was transferred to [facility] August 10, 2005 with  bilateral infiltrates,  unable to talk and ability to move only his right  arm.  He was found to have evidence of a positive RPR, significant  dysphagia, duodenal mass status post biopsy, elevated alk phos, leukocytosis  and anemia.  He underwent a CT angiogram read by Dr. Alfredo Batty on June 27,  20076 showing evidence of 1.3 cm length, 60% stenosis of the basilar artery  without total occlusion present.  It spanned approximately 1.3 cm and it  just terminated proximal to the takeoff of the superior cerebellar arteries.  He had a fetal-type left posterior cerebral artery and left vertebral artery  terminated in a pica.  CT angiogram of the neck showed bovine configuration  of the arch and significant tortuosity of the brachiocephalic artery and  dominant right vertebral artery with left vertebral terminating in the pica.  At that time, a CT scan was read as showing possible hemorrhage in the right  cerebellar region.  Subsequently, the patient has been found to have  evidence of  bilateral pulmonary emboli and at this study August 14, 2005 at  [facility] a catheter was placed.  I am asked to see the patient regarding  the question of anticoagulation in the face of recent possible hemorrhage  versus calcification in the right cerebellar region.   CURRENT MEDICATIONS:  Reglan, Protonix, NovoLog insulin, Ventolin,  vancomycin, Zosyn, central TNA, Tylenol and morphine sulfate which is p.r.n.   PAST HISTORY:  As above.  He did have a CSF evaluation at [facility] with glucose of 53.  CSF protein recorded as 91 on July 26, 2005.  A Blood glucose was 102.  I have not been able to find his CSF/VDRL.  He had  a B12 level of 294.  Serum ammonias of from 71 down to 15.   His examination currently shows a well-developed thin black male.  Blood  pressure lying in right arm not taken because of a PICC line, left arm  185/80, heart rate 104 in sinus tach, temperature was 98.3, O2 sat was 100%  2 liters.  He was alert.  He  would lift his head off the bed.  His eyes were  deviated to the right and the left had significant hypophoria.  He would not  cross the midline.  Mental status:  He was alert.  He would grip at times  with his right hand.  He was stick out his tongue.  He could not speak.  Other commands were hard to evaluate.  He did blink to scare on the right  and left.  His right pupil is larger than the left.  He had eyes deviated to  the right plus a left hypophoria.  I cannot pick up any definite facial  asymmetry.  He could protrude his tongue beyond his mouth.  It was hard to  get gag reflexes.  His motor examination revealed increased tone in the  upper extremities and lower extremities, worse on his left arm.  He also had  decorticate posturing on his left arm and extension of both legs, slight  withdrawal of both feet.  He did move his right arm and would open and close  his hand.  His deep tendon reflexes were increased in the left arm as  compared to the right.  He did have reflexes lower extremities.  Plantar  responses were bilaterally upgoing.   IMPRESSION:  1.  Multiple strokes, code 433.31, in the posterior circulation      distribution.  2.  Basilar artery stenosis, code 440.9.  3.  Positive RPR with cerebrospinal fluid evaluation having been obtained.      I do not have those final results at this time.  4.  Possible right cerebellar hemorrhage, code 431.   PLAN:  Plan at this time is to repeat the CT scan of the brain to see if  this is cerebellar hemorrhage but I am pretty certain this most likely is  hemorrhagic conversion of stroke and am hesitant to proceed with full  anticoagulation at this time in view of the location of the stroke in the  cerebellar region.           ______________________________  Genene Churn. Sandria Manly, M.D.     JML/MEDQ  D:  08/14/2005  T:  08/14/2005  Job:  [id number]

## 2010-07-03 NOTE — Assessment & Plan Note (Signed)
[facility]                         GASTROENTEROLOGY OFFICE NOTE   Tyler Beard, Tyler Beard                       MRN:          [id number]  DATE:05/25/2006                            DOB:          October 06, 1934    PROBLEM:  Duodenal mass.   Tyler Beard has returned for reevaluation.  He has a duodenal mass which  is suspicious for an adenocarcinoma.  This has not been biopsied in the  past because he is on Coumadin.  He has had a CVA, leaving him  hemiplegic.  He has a gastrostomy tube in place which he has been  tolerating.  His main complaint is constipation.  This is despite taking  lactulose 30 mL twice a day.  He receives Senna and he receives enemas  in order to have a bowel movement.   Medications include metoprolol, Prilosec, lactulose, trazodone and  Reglan.  He has no known allergies.   PHYSICAL EXAMINATION:  (The patient was examined in a wheelchair.)  VITAL SIGNS:  Pulse 72, blood pressure 100/76.  HEENT:  EOMI.  PERRLA.  Sclerae are anicteric.  Conjunctivae are pink.  NECK:  Supple without thyromegaly, adenopathy or carotid bruits.  CHEST:  Clear to auscultation and percussion without adventitious  sounds.  CARDIAC:  Regular rhythm; normal S1 S2.  There are no murmurs, gallops  or rubs.  ABDOMEN:  Bowel sounds are normoactive.  Abdomen is soft, non-tender and  non-distended.  There are no abdominal masses, tenderness, splenic  enlargement or hepatomegaly.  Gastrostomy tube is in place.  EXTREMITIES:  Full range of motion.  No cyanosis, clubbing or edema.  RECTAL:  There are no masses.  Stool is Hemoccult negative.   IMPRESSION:  1. Duodenal mass.  2. Functional constipation.   RECOMMENDATION:  1. Upper endoscopy with biopsy.  2. Begin MiraLax 17 g one to two times a day in addition to his      lactulose.     [name]. [name], MD,FACG  Electronically Signed    RDK/MedQ  DD: 05/25/2006  DT: 05/25/2006  Job #: [id number]   cc:    Gaspar Garbe, M.D.  Cathleen Corti, MD

## 2010-07-03 NOTE — Assessment & Plan Note (Signed)
[facility]                         GASTROENTEROLOGY OFFICE NOTE   MONTAE, STAGER                       MRN:          [id number]  DATE:06/23/2006                            DOB:          05-Mar-1934    PROBLEM:  Duodenal mass.   REASON:  Tyler Beard underwent endoscopy with partial removal of his  duodenal mass.  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  No invasive cancer was seen.   Tyler Beard remains asymptomatic with regard to this mass.   I discussed the findings with Tyler Beard and recommended that we do  nothing at this point.  While technically the polyp could be removed  endoscopically, it carries a risk of bleeding and perforation.  The  latter would definitely require surgery.  Given his other medical  problems which are severe, I have opted not to treat the polyp but to  continue to observe him.  I would like to reexamine the polyp in about  six months to see whether there has been substantial growth.  I  carefully instructed Tyler Beard to contact me if she feels he is having  symptoms from the mass including obstructive symptoms or bleeding.     [name]. [name], MD,FACG  Electronically Signed    RDK/MedQ  DD: 06/23/2006  DT: 06/23/2006  Job #: [id number]   cc:   Cathleen Corti, MD

## 2010-07-03 NOTE — Assessment & Plan Note (Signed)
[facility] OFFICE NOTE   Tyler Beard, Tyler Beard                       MRN:          [id number]  DATE:10/15/2005                            DOB:          May 11, 1934    CALLER:  [name].   He sees Dr. Cato Mulligan.   TELEPHONE:  [phone]   The complaint on my pager was constipated for six days.  However, I called  this number several times, and apparently it has been disconnected or is out  of service.  I informed this to the answering service to try to get an  alternative number if these people call back.                                   Tera Mater. Clent Ridges, MD   SAF/MedQ  DD:  10/22/2005  DT:  10/23/2005  Job #:  [id number]

## 2010-07-03 NOTE — Discharge Summary (Signed)
NAME:  Tyler Beard, Tyler Beard              ACCOUNT NO.:  [id number]   MEDICAL RECORD NO.:  [id number]          PATIENT TYPE:  INP   LOCATION:  1403                         FACILITY:  [facility]   PHYSICIAN:  Corinna L. Lendell Caprice, MDDATE OF BIRTH:  December 15, 1934   DATE OF ADMISSION:  11/19/2005  DATE OF DISCHARGE:  11/26/2005                                 DISCHARGE SUMMARY   DISCHARGE DIAGNOSES:  1. Vomiting, resolved.  2. 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  3. History of deep vein thrombosis and pulmonary emboli, status post      inferior vena cava filter.  4. Heme-positive stool.  5. Hypokalemia.  6. History of stroke resulting hemiparesis.  7. History of right cerebellar hemorrhage.  8. History of dysphasia and expressive aphasia.  9. Status post PEG tube placement   DISCHARGE MEDICATIONS:  1. His Coumadin has been stopped indefinitely.  2. He was started on Protonix suspension 40 mg per G tube daily.  3. His Reglan has been increased to 10 mg per G tube every 8 hours.  4. Continue Zoloft 100 mg per G tube daily.  5. Lasix 20 mg daily.  6. KCl 10 mEq daily.  7. Metoprolol 25 mg per G tube b.i.d.   TUBE FEEDINGS:  Have been changed from bolus to continuous Jevity at 65  mL/hr with 175 mL of free water per G tube four times daily.   Follow up with Dr. Wylene Simmer next week.   CODE STATUS:  Do not resuscitate, home hospice has been arranged.   CONSULTATIONS:  1. Hospice conditions.  2. [name]. [name], MD,FACG.   PROGNOSIS:  Less than 6 months.   GOALS:  Continue tube feeds and comfort care with no additional  hospitalizations.   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   PERTINENT LABORATORIES:  CBC on admission was significant for hemoglobin of  12.6, hematocrit of 37.  At discharge his hemoglobin is 11.4.  No  transfusions were needed.  INR on admission was 1.3.  Complete metabolic  panel significant for an albumin of 3.3, otherwise unremarkable.  Cardiac  enzymes negative.  UA showed large hemoglobin, 15 ketones, negative nitrite,  trace leukocyte esterase, 0-2 white cells, too numerous to count red cells.   SPECIAL STUDIES AND RADIOLOGY:  1. Chest x-ray showed nothing acute.  2. EKG showed normal sinus rhythm with left anterior fascicular block.   HISTORY AND HOSPITAL COURSE:  Tyler Beard is a 76 year old black male, who  was admitted to the hospitalist service as unassigned.  His primary care  physician is to be Dr. Wylene Simmer but he had not yet seen him in the office.  He has a complicated medical history including multiple recent strokes  resulting in left hemiparesis, dysphasia, and expressive aphasia.  This  patient also had history of a recently diagnosed duodenal tumor at an  outside facility.  He was apparently at that time felt to be not an  operative candidate.  He also  has a history of PE, IVC filter but was still  on Coumadin.  He has been having problems with vomiting and had to be  readmitted to the hospital on November 19, 2005, with vomiting.  He also had  heme positive stool at that time but stable vital signs and hemoglobin.  [facility] had followed the patient in the past and performed an EGD to rule  out obstruction from the mass.  It was non-obstructing and the  recommendation was to start tube feedings continuously, rather than bolus  form, and advance slowly.  At this time, he is at goal tube feedings and has  had no vomiting.  He has had no overt GI bleeding either.  It was felt that  the patient was not an operative candidate due to his multiple medical  issues and poor functional status.  Initially, the wife's expectations were  somewhat unrealistic, however, with further discussions and a hospice  consult, the decision was made for comfort care.  She wished to take the   patient home to continue tube feedings, to make him do not resuscitate, and  to avoid any further hospitalization..  The patient has expressed no  discomfort due to his prognosis of less than 6 months and the likelihood  that this tumor can bleed on Coumadin, the anticoagulation has been stopped  indefinitely.  The patient was started on a proton pump inhibitor and this  should be continued.  His Reglan was increased to 10 mg per G tube every 8  hours, and he may continue his other outpatient medications.  His primary  care physician can follow up for any necessary hospice orders.  I have left  a message with the wife today about the patient's discharge and changes in  medications.      Corinna L. Lendell Caprice, MD  Electronically Signed     CLS/MEDQ  D:  11/26/2005  T:  11/27/2005  Job:  [id number]   cc:   Tyler Beard, M.D.  Fax: [phone]   [name]. [name], MD,FACG  [address]  [place]  [place] [address]

## 2010-07-03 NOTE — Assessment & Plan Note (Signed)
[facility]                         GASTROENTEROLOGY OFFICE NOTE   VICTORIANO, CAMPION                       MRN:          [id number]  DATE:06/08/2006                            DOB:          1934/03/13    PROBLEM:  Duodenal polyp.   HISTORY:  [name] has returned following endoscopy.  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  No frank malignancy  was seen.  [name] has no symptoms referable to this.  Specifically,  he is without abdominal distention, nausea or vomiting.   PHYSICAL EXAMINATION:  VITAL SIGNS:  Pulse 72, blood pressure 118/72.   IMPRESSION:  Duodenal polyp.  Thought the biopsies are negative for  malignancy, there is still certainly a possibility that he could have  areas of carcinoma.  This may be a sampling error.   RECOMMENDATIONS:  Repeat endoscopy with at least a partial polypectomy  for better sampling.     [name]. [name], MD,FACG  Electronically Signed    RDK/MedQ  DD: 06/08/2006  DT: 06/08/2006  Job #: [id number]   cc:   Cathleen Corti, MD

## 2010-07-03 NOTE — Assessment & Plan Note (Signed)
[facility]                                   ON-CALL NOTE   MEKAI, WILKINSON                       MRN:          [id number]  DATE:09/27/2005                            DOB:          09-08-1934    PRIMARY CARE PHYSICIAN:  Lorinda Creed, N. P.   DATE OF PHONE CALL:  September 27, 2005.   [employer] called from [facility] regarding Mr. Berlinger's labs  that were ordered. She states that he had an INR of 1.5 and he was currently  taking 4 mg daily of Coumadin except for Monday when he is supposed to take  3.  She also stated his hemoglobin was slightly low but he has a history of  normocytic anemia. The patient is at home and she wanted advice on  medication management tonight. She did state that Mr. Cofer was stable and  had no problems.   PLAN:  1. Advised Ms. Parliament to make sure she calls Lorinda Creed tomorrow to      discuss other laboratory data and her subsequent management of Mr.      Maslanka's medications.  In the meantime I agreed to have Mr. Kicklighter take      4 mg of Coumadin tonight as opposed to his usual dose of 3 mg. Ms.      Parliament agreed and will pass on the laboratory data to Lorinda Creed      tomorrow.                                   Leanne Chang, MD   LA/MedQ  DD:  09/29/2005  DT:  09/29/2005  Job #:  [id number]   cc:   Lorinda Creed, NP

## 2010-07-03 NOTE — Discharge Summary (Signed)
NAME:  Tyler Beard, Tyler Beard              ACCOUNT NO.:  [id number]   MEDICAL RECORD NO.:  [id number]          PATIENT TYPE:  INP   LOCATION:  4712                         FACILITY:  [facility]   PHYSICIAN:  C. Ulyess Mort, M.D.DATE OF BIRTH:  1934/05/01   DATE OF ADMISSION:  09/28/2005  DATE OF DISCHARGE:  10/02/2005                                 DISCHARGE SUMMARY   DISCHARGE DIAGNOSES:  1. Multiple cerebrovascular accidents.  2. Pulmonary embolus status post inferior vena cava filter placement.  3. History of ileus.  4. 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  5. Nausea, vomiting and diarrhea.   DISCHARGE MEDICATIONS:  1. Coumadin 3 mg per tube to be followed up with INR testing later.  2. Ferrous sulfate.  3. Metoprolol 25 mg per tube twice daily.  4. Potassium chloride 10 mEq daily.  5. Prevacid 30 mg daily.  6. Zoloft 100 mg daily.  7. Metoclopramide 5 mg t.i.d.  8. Kaopectate 240 mg daily.  9. Lasix 20 mg daily.  10.Zofran 4 mg q.4 h if the patient is having nausea.   DISPOSITION:  Tyler Beard is discharged home with his wife, with assistance  of home health.  Home health was to come and draw an INR on Monday after  discharge.  He was to follow up with the clinic as needed.   HISTORY AND PHYSICAL:  Please see full H&P in the chart for details.  At the  time of admission, his vitals were temperature of 99.6, BP of 154/93, pulse  of 109, 96% on room air.  He was diaphoretic in no acute distress.  Pupils  were equal, round and reactive.  Extraocular muscles were intact.  The  oropharynx was clear.  The neck was supple, no JVD.  Lungs had shallow  breaths, negative rhonchi, tachycardic.  Regular rate and rhythm.  His  abdomen was flat, positive bowel sounds, PEG intact, clean around the  placement of the PEG.  1-2+ swelling bilaterally.  Neurologically, the  patient had a dense hemiparesis on the left.  He was also extremely weak on  the right  side.   HOSPITAL COURSE  Nausea, vomiting and diarrhea.  This was likely secondary to obstipation.  No evidence for infection.  The patient was afebrile, normal white blood  cells and negative for C. difficile.  He was eventually treated with  metoclopramide.  He was also treated with Zofran for nausea and vomiting.  By the time he was discharged, he was actually not feeling any nausea or  vomiting.   History of Bilateral PE.  He was treated with Lovenox and Coumadin as per  pharmacy.  Hypertension was treated with Toprol while in the hospital.  Anemia was stable while in hospital.  Fecal occult blood testing was  negative.   Prophylaxis:  While in the hospital, he was treated with Protonix and  Lovenox.  He was then discharged home with metoclopramide and Zofran for his  nausea and vomiting.      Hollace Hayward, M.D.  Electronically Signed      C. Ulyess Mort,  M.D.  Electronically Signed    TE/MEDQ  D:  11/25/2005  T:  11/26/2005  Job:  [id number]

## 2010-07-03 NOTE — Discharge Summary (Signed)
NAME:  Tyler Beard, OBST NO.:  [id number]   MEDICAL RECORD NO.:  [id number]          PATIENT TYPE:  INP   LOCATION:  6711                         FACILITY:  [facility]   PHYSICIAN:  Vanetta Mulders, MD         DATE OF BIRTH:  Jul 09, 1934   DATE OF ADMISSION:  08/10/2005  DATE OF DISCHARGE:  08/21/2005                                 DISCHARGE SUMMARY   ATTENDING PHYSICIAN:  Dr. Harvie Junior.   DISCHARGE MEDICATIONS:  1.  Reglan 10 mg per tube t.i.d.  2.  Protonix 40 mg p.o. daily.  3.  Metoprolol 25 mg p.o. b.i.d.  4.  Jevity 65 mL per hour per tube.  5.  Free water per tube 150 mL 3 times a day.  6.  Colace 100 mg per tube b.i.d. p.r.n.  7.  Tylenol 650 mg per tube 3 times per day as needed for pain or fever.  8.  Morphine 0.521 mg per tube q.8h. p.r.n. for pain and discomfort.  9.  Coumadin 2 mg per tube daily.  Patient will need adjustment for a goal      of INR of 2.0-3.0 per primary care physician.  Per neurology      recommendations, we need to give him small doses of Coumadin daily until      we reach the goal INR.   DISCHARGE DIAGNOSES:  1.  Multiple strokes in the posterior circulation distribution.  2.  Basilar artery stenosis.  3.  Secondary syphylis s/p treatment.  4.  Possible right cerebellar hemorrhage seen on an outside hospital MRI.  5.  Bilateral pulmonary infiltrates, possible diagnosis of aspiration      pneumonia.  6.  Duodenal polyp removed at esophagogastroduodenoscopy with biopsy      positive for high-grade dysplasia.  7.  Adynamic ileus.  8.  Pulmonary embolism bilaterally.  9.  Bilateral lower extremity deep vein thrombosis.  10. Dysphagia status post percutaneous endoscopic gastrostomy tube      placement.   DISCHARGE INSTRUCTIONS:  Patient is going to be discharged to a skilled  nursing facility.  He will continue nutrition per PEG tube since he was  diagnosed with dysphagia, and he developed aspiration pneumonia post stroke.  He will be on  Jevity with a goal of 55 mL per hour.  He is supposed to be on  Coumadin.  He is status post inferior vena cava flter placement.  The goal  INR is 2.0-3.0.  Per neurology recommendation, patient will need to be  followed up by the skilled nursing facility physician with regular PT/INRs  and PTTs.   CONSULTATIONS ON THIS ADMISSION:  1.  Interventional radiology for placement of inferior vena cava filter for      DVT prophylaxis.  2.  Dr. Sandria Manly with neurology.   PROCEDURES ON THIS ADMISSION:  Inferior vena cava filter placement.   IMAGING:  1.  Chest x-ray on August 11, 2005 showed mild cardiomegaly, diffuse      peribronchial thickening.  No definite edema or acute infiltrate.  Some      bilateral basilar atelectasis.  2.  Abdomen x-ray on August 11, 2005 shows distention allocating redundant      sigmoid colon.  No evidence of obstruction, mass or significant      calcification.  No fecal impaction.  3.  CT of the head on August 11, 2005 showed 60% stenosis in distal basilar      artery extending approximately 1.3 cm terminating just proximal to the      takeoff of the superior cerebellar artery. Fetal-type posterior cerebral      artery. The left vertebral artery terminates in the PICA .  4.  CT angiogram of the neck on the same day, August 11, 2005, showed bovine      configuration of the arch, significant tortuosity of the brachial      cephalic artery, dominant right vertebral artery with the left vertebral      artery terminating in the PICA .  X-ray of the abdomen on August 12, 2005      showed mild ileus with improvement since previous study.  5.  Upper Gastrografin study with KUB showed 6 x 3-cm lobulated,      intraluminal at the junction of the second and third portions of the      duodenum.  This is consistent with a duodenal tumor.  It was seen as      well on endoscopy at previous hospitalization to an outside hospital,      and does not cause any appreciable obstruction to fluid.   The appearance      is nonspecific.  However, a mass of this size is worrisome for a      malignancy.  6.  CT of the pelvis and the chest  done on August 13, 2005 showed acute      pulmonary embolus to subsegmental coronary arteries to the right middle      and both lower lobes.  Density seen in the posterior costophrenic sulcus      bilaterally.  This may be related to pulmonary infarction.  Radiology      suggests followup imaging to explore the neoplasm. Tree-bud oppacity in      the right middle lobe suggests bronchopneumonia.  No definite acute      findings in the abdomen.  There is no evidence for intraperitoneal free      air.  No convincing evidence for abscess and bilateral common femoral      vein DVT seen on CT of the pelvis with contrast.  7. On August 14, 2005,      patient underwent IVC catheterization and venogram, and the IVC filter      was successfully placed by ultrasound and fluoroscopically.  7.  CT of the head on August 14, 2005 showed stable small right thalamic      infarct.  No evidence for hemispheric infarction or intracranial      hemorrhage.  Study was very limited at that time due to motion artifact.  8.  MRI of the brain on August 14, 2005 shows limited exam again revealing      blood breakdown products within the cerebellum, right greater than left      pontine infarct.  9.  X-ray of the abdomen on August 15, 2005 showed no evidence for obstruction.  10. Abdominal x-ray on August 17, 2005 showed G tube in the stomach, patent and      functioning well.  11. Abdomen x-ray on August 20, 2005 showed a nonspecific bowel gas pattern  with advancement of oral contrast into distal colon.  No evidence of      obstruction.   HISTORY OF PRESENT ILLNESS:  [name] is a 76 year old African-American  male who was transferred from [facility] in [place] to [facility].  The transfer was done on August 10, 2005 for evaluation and  recuperation from stroke.  Mr. Lariccia has no  known prior history of stroke, hypertension, diabetes, heart disease or  cigarette use.  On the morning of Jul 05, 2005, he was found in his bedroom  with urine and vomitus present, and unable to talk.  He was admitted to [facility] in [place], [place] where he was noted to have  right hemiparesis by one physician.  There is another note from Dr. August Saucer who  indicated that the patient had left hemiparesis. Initial CT scan raised the  question of right thalamic stroke.  MRI study on Jul 11, 2005 at [facility] showed evidence of multiple strokes, all of them acute,  involving the right basal ganglia, the right cerebral peduncle, the left  pons and both cerebellar hemispheres without any definite hemorrhage.  Repeat CT scan on Jul 07, 2005 showed multiple signs of infarction.  In the  hospital, patient was unable to talk.  He underwent Doppler studies of the  carotids showing both carotids normal.  For significant dysphagia and a  duodenal mass patient underwent upper EGD with biopsy and a PEG placement. A  duodenal polyp was found and removed.  The polyp biopsy was positive for  high-grade dysplasia.  Patient continually had elevated alkaline  phosphatase, leukocytosis and anemia.  He was transferred to [facility] per patient' family request. At [facility], patient was  admitted on August 11, 2005.  He was considered status post acute CVA  complicated with aphasia, left hemiparesis, dysphagia.  Patient also  presented at that time with fever, new pulmonary infiltrates.   PHYSICAL EXAMINATION:  VITAL SIGNS:  On admission:  Temperature of 99.6, a  blood pressure of 126/77, heart rate 68, respiratory rate 20, O2 sat 98% on  2 L.  GENERAL:  Patient was lethargic, somewhat alert.  He would lift his head off  the bed.  EYES:  His eyes were deviated to the right, and the left had significant   hypophoria.  He would not cross the midline.  MENTAL STATUS:  He was alert.  He would grip at times with his right hand.  He would stick out his tongue, but he could not speak.  He would not follow  other commands.  He did blink to scare on the right and left.  NEUROLOGICAL:  His right pupil was larger than the left.  He had eyes  deviated to the right/left hypophoria.  There was no definite facial droop.  He could protrude his tongue beyond his mouth.  Gag reflexes were hard to  evaluate.  Motor examination revealed increased tone in the upper  extremities and lower extremities, worse on his left arm.  He had some  spontaneous movement on his right arm, but again, he would not follow  commands with his arms or sometimes with his arms or legs.  He also had  decorticate posturing on his left arm and extension of both legs, slight  withdrawal of both feet.  He would open and close his hands.  His deep  tendon reflexes were increased  in the left arm compared to the right. Reflexes were normal in lower extremities.  Plantar reflexes were  bilaterally upgoing.  NECK:  Supple.  No lymphadenopathy.  No JVD.  CARDIOVASCULAR:  Regular rate and rhythm.  No murmurs or rubs.  No gallops.  RESPIRATORY:  Bilateral basilar crackles.  Few rhonchi.  No wheezing.  Moderate air movement.  GI:  Abdomen was soft, nontender, nondistended.  No rebound or guarding.  PEG was in right periumbilical area.  There was no surrounding inflammation  of the PEG placement site.  SKIN:  Showed no rash.  No lymphadenopathy.  PSYCHIATRIC:  Cannot be assessed.  The patient would not talk.   LABORATORIES ON ADMISSION:  Sodium 135, potassium 2.6, chloride 99, bicarb  30, BUN 9, creatinine 0.8, glucose 164, anion gap 10, alkaline phosphatase  190.  SGOT 28, SGPT 43, protein 6.9, albumin 2.4, amylase 88, lipase 79, pre-  albumin 11.3.  CHF and lumbar puncture, outside hospital labs showed clear,  white blood cells 1, red blood  cells 925, polymorphism with 58%.  Previous  HIV studies were negative.  Rheumatoid factor was negative.  ANA was  negative.  Blood cultures were negative at the outside hospital.  INR on  admission was 1.2, PT 16.4, PTT 33.3.   PROBLEM LIST AND HOSPITAL COURSE:  1.  A 76 year old African-American man status post acute multiple      cerebrovascular accidents in May admitted to an outside hospital, and      transferred to [facility] for continuity of care.  At the time of      admission, patient was started on supportive therapy and physical      therapy.  Dysphagia prohibited him from being fed.  He already had the      PEG tube placed at an outside hospital that was used for nutrition.      Because we could not appreciate the patency of the PEG at the beginning,      and patient presented with adynamic ileus, initially he was started on      TNA and then transitioned to tube feedings.  2.  Pulmonary infiltrates bilaterally were considered most likely secondary      to aspiration.  Patient was treated for aspiration pneumonia with      Vancomycin and Zosyn.  His fever and leukocytosis were considered      secondary to this problem, and they resolved with antibiotics.  3.  Positive rapid plasma reagin in blood. Patient was treated with      Penicilline im for secondary syphyllis.  4.  Anemia.  Stool was sent for studies, and it showed positive FOBT.      Patient was not considered a candidate for colonoscopy.  Adynamic ileus      improved in time with supportive medication, and hemoglobin has been      stable throughout hospitalization, and normalized at discharge.  5.  Deep vein thrombosis of lower extremity.  Patient was, during      hospitalization, bed bound.  He developed left lower extremity DVT.  He      was not considered a candidate for aggressive anticoagulation with      Lovenox or heparin secondary to his multiple CVAs so he received an IVC      filter, and he was placed, per  neurology recommendations, on Coumadin.     He was started on a low dose of 2 mg daily.  The goal INR would  be 2.0-      3.0.  He needs followup for this with his primary care physician while      in the nursing home.  6.  Suspicion of duodenal mass.  We received records from [facility] of      the biopsy of the polyp that was removed during an EGD done there, and      the pathology showed high-grade dysplasia, now adenocarcinoma.  This was      considered the most likely cause for the previous problem of patient's      anemia and FOBT positive.  7.  Pulmonary embolism.  As I said previously, patient underwent IVC filter      placement, and he is currently on Coumadin.  INR is not at goal.  He      will need followup in nursing home.   DISPOSITION:  Patient is generally unchanged compared to previous  hospitalization and admission.  His bilateral pulmonary infiltrates improved  with antibiotics.  At this time, the patient is stable to be discharged to a  skilled nursing facility with the recommendation to follow up on PT/INR and  Coumadin dosage.   LABORATORIES ON DISCHARGE:  Hemoglobin 12.3.  Sodium 135, potassium 3.8,  chloride 100, bicarbonate 28, BUN 12, creatinine 0.5.  Total bili 1.9,  alkaline phosphatase 133,  AST 29, ALT 44, protein 6.7, albumin 2.5, calcium 8.7, phosphorus 3.2,  magnesium 2.1.  Vital signs showed no fever; temperature 98.0, blood  pressure 140-160, diastolic 90-97, heart rate 90-100.  Patient saturates at  97% on room air.      Vanetta Mulders, MD  Electronically Signed     DA/MEDQ  D:  08/20/2005  T:  08/20/2005  Job:  [id number]

## 2010-07-03 NOTE — H&P (Signed)
NAME:  KAYRON, HICKLIN NO.:  [id number]   MEDICAL RECORD NO.:  [id number]          PATIENT TYPE:  INP   LOCATION:  1824                         FACILITY:  [facility]   PHYSICIAN:  Lucita Ferrara, MD         DATE OF BIRTH:  05-Sep-1934   DATE OF ADMISSION:  11/16/2005  DATE OF DISCHARGE:                                HISTORY & PHYSICAL   The patient is a 76 year old male with past medical history significant for  CVA which happened Jul 05, 2005 which left him to have a deficit on the left  side.  Came to [facility] with intractable vomiting that has  been going on for the last two days.  Wife says that she attributes the  vomiting to a small bowel tumor which was discovered during endoscopy a  hospital in [place], [place] where the patient spent a month and  a half after his stroke for rehabilitation.  Apparently the patient was  there and the wife actually took him out of there as she thought he was not  getting adequate care.  Apparently, the patient had a PEG tube placed and  aspirated.  The patient then came to [facility] where there was  found to be blood clots in the legs.  A filter was placed and then the  patient went home.  According to the wife, the patient has been doing much  better since he has gone home until about a week ago when he started having  intractable vomiting.  She denies any fever.  She denies any focal abdominal  pain.  There is no diarrhea.   PAST MEDICAL HISTORY:  1. Significant for cerebral hemorrhage.  2. Cerebrovascular accident.  3. Gastroesophageal reflux disease.  4. Pulmonary embolism.   SOCIAL HISTORY:  He denies drugs, smoking or alcohol.  He lives with his  relatives.   MEDICATIONS:  1. The patient is taking Coumadin per dose.  2. Metoprolol 25 mg b.i.d.  3. Metoclopramide 5 mg four times a day.  4. Potassium chloride 10 mEq once a day.  5. Zoloft 100 mg once a day.  6. Lasix 20 mg once a  day.   PAST SURGICAL HISTORY:  The patient had an endoscopy.  Other than he has not  had any major surgeries.   ALLERGIES:  No known drug allergies.   PHYSICAL EXAMINATION:  VITAL SIGNS:  Temperature is 98.3, blood pressure is  119/85, pulse is 121, respirations 18, pulse ox 96% on room air.  GENERAL:  The patient is in no acute distress.  HEENT:  PERRLA.  Mucus membranes are moist.  Normocephalic and atraumatic.  Sclerae is anicteric.  CARDIOVASCULAR:  S1-S2, regular rate and rhythm.  No murmurs or rubs.  ABDOMEN:  Soft, nontender, and nondistended.  Positive bowel sounds.  Guaiac  is negative.  LUNGS:  Clear to auscultation bilaterally.  No wheezes, no rhonchi.  NEUROLOGICAL:  The patient is alert and oriented x3.  The patient has a  diffuse hemiparesis on the left side.  He also is weak  on the right side.  He is spastic on the left side.  Sensation is intact bilaterally.  The rest  of the neurological exam could not be assessed.  EXTREMITIES:  No clubbing, cyanosis, or edema.   LABORATORY DATA:  White count 15.7, hemoglobin 13.7, hematocrit 42.3,  platelet 313.  Neutrophils 86%, lymphocytes 10%.  CMP:  Sodium 137,  potassium 4.1, chloride 99, CO2 29, glucose 115, BUN 7, creatinine 0.5,  calcium 9.6, AST 20, ALT 15, alkaline phosphate 56.  UA was essentially  negative.  The patient was given 500 mL bolus in the emergency room.   ASSESSMENT/PLAN:  1. The patient is a 76 year old male with past medical history significant      for cerebrovascular accident who presents with intractable vomiting for      cerebrovascular accident:  For his intractable vomiting, we are going      to admit him, place him n.p.o.  I do not think the patient at this time      needs a nasogastric tube.  His abdomen is soft.  May consider doing a      CT scan of the abdomen considering his history of tumor in the small      bowel.  Also need to get records from hospital in which he had the      endoscopy.   We will do a CT scan of the abdomen with IV contrast to      visualize the apparent mass that has been described by the wife.  2. We will continue with the Reglan 10 mg q.4h. for vomiting and we will      continue q.i.d. around the clock.  We will go ahead and cover him with      broad-spectrum antibiotics, Levaquin 500 mg IV daily.      Lucita Ferrara, MD  Electronically Signed     RR/MEDQ  D:  11/16/2005  T:  11/17/2005  Job:  [id number]

## 2010-11-06 LAB — BASIC METABOLIC PANEL
BUN: 7
BUN: 8
CO2: 26
CO2: 30
CO2: 30
Calcium: 9.1
Calcium: 9.4
Chloride: 97
Creatinine, Ser: 0.84
GFR calc Af Amer: >60
GFR calc non Af Amer: >60
GFR calc non Af Amer: >60
Glucose, Bld: 130 — ABNORMAL HIGH
Glucose, Bld: 87
Potassium: 3.8
Potassium: 3.9
Sodium: 138

## 2010-11-06 LAB — CBC
HCT: 35.8 — ABNORMAL LOW
HCT: 36.6 — ABNORMAL LOW
MCHC: 33.1
MCHC: 33.4
MCHC: 33.7
MCV: 87.3
Platelets: 193
Platelets: 206
RBC: 4.55
RDW: 14.4
RDW: 14.6
RDW: 14.9

## 2010-11-06 LAB — DIFFERENTIAL
Basophils Absolute: 0
Basophils Relative: 0
Eosinophils Absolute: 0.3
Monocytes Relative: 10
Neutrophils Relative %: 67

## 2010-11-06 LAB — CARDIAC PANEL(CRET KIN+CKTOT+MB+TROPI)
CK, MB: 0.8
CK, MB: 0.8
Total CK: 248 — ABNORMAL HIGH
Total CK: 250 — ABNORMAL HIGH
Total CK: 254 — ABNORMAL HIGH

## 2010-11-23 LAB — APTT: aPTT: 30

## 2010-11-23 LAB — DIFFERENTIAL
Basophils Absolute: 0
Lymphocytes Relative: 13
Lymphs Abs: 1.4
Neutro Abs: 8.6 — ABNORMAL HIGH
Neutrophils Relative %: 82 — ABNORMAL HIGH

## 2010-11-23 LAB — PROTIME-INR
INR: 1
Prothrombin Time: 13.6

## 2010-11-23 LAB — CBC
HCT: 40.1
Platelets: 241
RDW: 14.2
WBC: 10.6 — ABNORMAL HIGH

## 2010-11-23 LAB — BASIC METABOLIC PANEL
BUN: 10
Calcium: 9.3
Creatinine, Ser: 0.78
GFR calc non Af Amer: >60
Glucose, Bld: 113 — ABNORMAL HIGH
Potassium: 4.4

## 2011-05-27 ENCOUNTER — Telehealth: Payer: Self-pay | Admitting: Gastroenterology

## 2011-05-27 NOTE — Telephone Encounter (Signed)
Pt has been vomiting about 3 times/week. Requesting that pt be seen sooner than first available. Pt scheduled to see Dr. [name] 06/02/11@10 :30am. Pt aware of appt date and time.

## 2011-06-02 ENCOUNTER — Ambulatory Visit: Payer: Self-pay | Admitting: Gastroenterology

## 2011-07-02 ENCOUNTER — Ambulatory Visit: Payer: Self-pay | Admitting: Gastroenterology

## 2011-12-27 ENCOUNTER — Telehealth: Payer: Self-pay | Admitting: Gastroenterology

## 2011-12-27 NOTE — Telephone Encounter (Signed)
Pt scheduled to see Willette Cluster NP tomorrow at 3pm. Pt is on a clear liquid diet but having abdominal pain, nausea and vomiting. Requested appt.

## 2011-12-28 ENCOUNTER — Ambulatory Visit: Payer: Self-pay | Admitting: Nurse Practitioner

## 2011-12-29 ENCOUNTER — Ambulatory Visit (INDEPENDENT_AMBULATORY_CARE_PROVIDER_SITE_OTHER): Payer: Medicare Other | Admitting: Nurse Practitioner

## 2011-12-29 ENCOUNTER — Encounter: Payer: Self-pay | Admitting: *Deleted

## 2011-12-29 VITALS — BP 130/86 | HR 73 | Ht 66.0 in | Wt 200.0 lb

## 2011-12-29 DIAGNOSIS — K5900 Constipation, unspecified: Secondary | ICD-10-CM | POA: Insufficient documentation

## 2011-12-29 DIAGNOSIS — R112 Nausea with vomiting, unspecified: Secondary | ICD-10-CM

## 2011-12-29 NOTE — Patient Instructions (Addendum)
We will call you with a date for the Endoscopy with Dr. [name] at the hospital. We have given you samples of the Linzess, Take 1 daily. Take the Kristalose 10 mg twice daily. Call us if you are not able to tolerate these medications, with vomiting.

## 2011-12-30 ENCOUNTER — Other Ambulatory Visit: Payer: Self-pay | Admitting: *Deleted

## 2011-12-30 ENCOUNTER — Telehealth: Payer: Self-pay | Admitting: *Deleted

## 2011-12-30 ENCOUNTER — Encounter: Payer: Self-pay | Admitting: Nurse Practitioner

## 2011-12-30 DIAGNOSIS — R112 Nausea with vomiting, unspecified: Secondary | ICD-10-CM

## 2011-12-30 NOTE — Telephone Encounter (Signed)
Called patient's wife to inform her of the appointment at [facility], 1st floor.  Date is 01-10-2012 and they need to arrive at 11:30 AM .  I did advise her that the hospital may call them on Sunday and change the time by 15 min or so.  I advised her that he should have no food or drink after midnight.  She thanked me for calling.

## 2011-12-30 NOTE — Progress Notes (Signed)
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 At the time of last upper endoscopy February 2011 the villous mass in the second part of the duodenum was described as being at least 4-5 cm in size. The mass was friable, partially occluding the lumen.   Patient presents today for evaluation of constipation. Daily MiraLax usually keeps his bowels regular but lately the MiraLax in addition to milk of magnesia and enemas have not helped constipation. No abdominal pain. Etiology for acute on chronic obstipation not clear. Patient denies starting any new medication. No dietary changes. He does not have abdominal pain but does mention intermittent nausea and vomiting over the last few weeks.  Current Medications, Allergies, Past Medical History, Past Surgical History, Family History and Social History were reviewed in [employer] record.   Physical Exam: General: Well developed , black male in no acute distress Head: Normocephalic and atraumatic Eyes:  sclerae anicteric, conjunctiva pink  Ears: Normal auditory acuity Lungs: Decreased breath sounds at the bilateral bases Heart: Regular rate and rhythm Abdomen: Limited exam, patient in wheelchair. He has hemiparesis from a stroke. Abdomen is soft , non tender and non distended. Normal bowel sounds  Musculoskeletal: Symmetrical with no gross deformities  Extremities: No edema  Neurological: Alert oriented x 4, grossly nonfocal Psychological:  Alert and cooperative. Normal mood and affect  Assessment and Recommendations: 10. 77 year old male with acute on chronic constipation. Suspect functional constipation. He has been taking MiraLax, and milk of magnesium. Patient lives at [facility] rehabilitation, staff has been giving him enemas over the last few days. I gave patient samples of Linzess to take  every day for the next 7-10 days. In addition I gave him samples of Kristalose to take twice daily for the next 3-4 days. Hopefully these medications will jump start his bowels .  2. nausea and vomiting, intermittent over the last 3 weeks. While this could be secondary to constipation, need to consider nausea and vomiting being secondary to #3.   3. 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 Partial occlusion of the lumen by this large polyp. Polyp biopsied but not removed for unclear reasons (possibly because of slow growth rate/patient's comorbidities). Patient now presenting with intermittent nausea and vomiting. Need to rule out further luminal occlusion by this giant polyp. For further evaluation patient will be scheduled for upper endoscopy with possible stent placement.The benefits, risks, and potential complications of EGD with possible biopsies and stenting were discussed with the patient and he agrees to proceed.   4. family history of colon cancer and father. Patient is up to date on his screening colonoscopies, last colonoscopy February 2011.  5. history of CVA/ hemiparesis

## 2012-01-03 ENCOUNTER — Telehealth: Payer: Self-pay | Admitting: *Deleted

## 2012-01-03 DIAGNOSIS — R1310 Dysphagia, unspecified: Secondary | ICD-10-CM

## 2012-01-03 NOTE — Telephone Encounter (Signed)
patietn has been scheduled for an xray on 11/22  Nurse at [facility] aware  Minerva Ends

## 2012-01-03 NOTE — Progress Notes (Signed)
Reviewed and agree with management.  I will also schedule an upper GI series prior to endoscopy to see whether he has functional gastric outlet obstruction. [name]. [name], M.D., [facility]

## 2012-01-04 ENCOUNTER — Telehealth: Payer: Self-pay | Admitting: Gastroenterology

## 2012-01-04 ENCOUNTER — Encounter (HOSPITAL_COMMUNITY): Payer: Self-pay | Admitting: *Deleted

## 2012-01-04 NOTE — Telephone Encounter (Signed)
Discussed with the hospital that he lives at [facility]. Phone number found in one of the telephone notes and given to them. Hospital will try and reach him at that number.

## 2012-01-05 ENCOUNTER — Ambulatory Visit (HOSPITAL_COMMUNITY): Payer: Medicare Other

## 2012-01-07 ENCOUNTER — Ambulatory Visit (HOSPITAL_COMMUNITY)
Admission: RE | Admit: 2012-01-07 | Discharge: 2012-01-07 | Disposition: A | Discharge status: Home / Self Care | Payer: PRIVATE HEALTH INSURANCE | Source: Ambulatory Visit | Attending: Gastroenterology | Admitting: Gastroenterology

## 2012-01-07 ENCOUNTER — Encounter (HOSPITAL_COMMUNITY): Payer: Self-pay | Admitting: *Deleted

## 2012-01-07 DIAGNOSIS — R1310 Dysphagia, unspecified: Secondary | ICD-10-CM | POA: Insufficient documentation

## 2012-01-07 NOTE — Progress Notes (Signed)
01-07-12 Attention Oscar La -fax # [phone]

## 2012-01-10 ENCOUNTER — Encounter (HOSPITAL_COMMUNITY)
Admission: RE | Disposition: A | Discharge status: Skilled Nursing Facility | Payer: Self-pay | Source: Ambulatory Visit | Attending: Gastroenterology

## 2012-01-10 ENCOUNTER — Ambulatory Visit (HOSPITAL_COMMUNITY)
Admission: RE | Admit: 2012-01-10 | Discharge: 2012-01-10 | Disposition: A | Discharge status: Skilled Nursing Facility | Payer: PRIVATE HEALTH INSURANCE | Source: Ambulatory Visit | Attending: Gastroenterology | Admitting: Gastroenterology

## 2012-01-10 ENCOUNTER — Ambulatory Visit (HOSPITAL_COMMUNITY): Payer: PRIVATE HEALTH INSURANCE | Admitting: Anesthesiology

## 2012-01-10 ENCOUNTER — Encounter (HOSPITAL_COMMUNITY): Payer: Self-pay

## 2012-01-10 ENCOUNTER — Encounter (HOSPITAL_COMMUNITY): Payer: Self-pay | Admitting: Anesthesiology

## 2012-01-10 ENCOUNTER — Ambulatory Visit (HOSPITAL_COMMUNITY): Payer: PRIVATE HEALTH INSURANCE

## 2012-01-10 DIAGNOSIS — D1330 Benign neoplasm of unspecified part of small intestine: Secondary | ICD-10-CM | POA: Insufficient documentation

## 2012-01-10 DIAGNOSIS — I10 Essential (primary) hypertension: Secondary | ICD-10-CM | POA: Insufficient documentation

## 2012-01-10 DIAGNOSIS — K319 Disease of stomach and duodenum, unspecified: Secondary | ICD-10-CM | POA: Insufficient documentation

## 2012-01-10 DIAGNOSIS — Z800 Family history of malignant neoplasm of digestive organs: Secondary | ICD-10-CM | POA: Insufficient documentation

## 2012-01-10 DIAGNOSIS — K449 Diaphragmatic hernia without obstruction or gangrene: Secondary | ICD-10-CM | POA: Insufficient documentation

## 2012-01-10 DIAGNOSIS — Z8673 Personal history of transient ischemic attack (TIA), and cerebral infarction without residual deficits: Secondary | ICD-10-CM | POA: Insufficient documentation

## 2012-01-10 DIAGNOSIS — K219 Gastro-esophageal reflux disease without esophagitis: Secondary | ICD-10-CM | POA: Insufficient documentation

## 2012-01-10 DIAGNOSIS — R112 Nausea with vomiting, unspecified: Secondary | ICD-10-CM | POA: Insufficient documentation

## 2012-01-10 DIAGNOSIS — K5900 Constipation, unspecified: Secondary | ICD-10-CM | POA: Insufficient documentation

## 2012-01-10 HISTORY — PX: ESOPHAGOGASTRODUODENOSCOPY: SHX5428

## 2012-01-10 SURGERY — EGD (ESOPHAGOGASTRODUODENOSCOPY)
Anesthesia: Monitor Anesthesia Care

## 2012-01-10 MED ORDER — PROPOFOL 10 MG/ML IV EMUL
INTRAVENOUS | Status: DC | PRN
  Administered 2012-01-10: 70 ug/kg/min via INTRAVENOUS

## 2012-01-10 MED ORDER — SODIUM CHLORIDE 0.9 % IV SOLN: INTRAVENOUS | Status: DC

## 2012-01-10 MED ORDER — MIDAZOLAM HCL 5 MG/5ML IJ SOLN
INTRAMUSCULAR | Status: DC | PRN
  Administered 2012-01-10: 2 mg via INTRAVENOUS

## 2012-01-10 MED ORDER — LACTATED RINGERS IV SOLN
INTRAVENOUS | Status: DC | PRN
  Administered 2012-01-10: 13:00:00 via INTRAVENOUS

## 2012-01-10 MED ORDER — KETAMINE HCL 50 MG/ML IJ SOLN
INTRAMUSCULAR | Status: DC | PRN
  Administered 2012-01-10: 25 mg via INTRAMUSCULAR

## 2012-01-10 MED ORDER — ONDANSETRON HCL 4 MG/2ML IJ SOLN
INTRAMUSCULAR | Status: DC | PRN
  Administered 2012-01-10: 4 mg via INTRAVENOUS

## 2012-01-10 MED ORDER — LACTATED RINGERS IV SOLN: INTRAVENOUS | Status: DC

## 2012-01-10 MED ORDER — FENTANYL CITRATE 0.05 MG/ML IJ SOLN
INTRAMUSCULAR | Status: DC | PRN
  Administered 2012-01-10: 50 ug via INTRAVENOUS

## 2012-01-10 NOTE — Transfer of Care (Signed)
Immediate Anesthesia Transfer of Care Note  Patient: Tyler Beard  Procedure(s) Performed: Procedure(s) (LRB) with comments: ESOPHAGOGASTRODUODENOSCOPY (EGD) (N/A)  Patient Location: PACU  Anesthesia Type:MAC  Level of Consciousness: sedated  Airway & Oxygen Therapy: Patient Spontanous Breathing and Patient connected to nasal cannula oxygen  Post-op Assessment: Report given to PACU RN and Post -op Vital signs reviewed and stable  Post vital signs: Reviewed and stable  Complications: No apparent anesthesia complications

## 2012-01-10 NOTE — H&P (View-Only) (Signed)
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 At the time of last upper endoscopy February 2011 the villous mass in the second part of the duodenum was described as being at least 4-5 cm in size. The mass was friable, partially occluding the lumen.   Patient presents today for evaluation of constipation. Daily MiraLax usually keeps his bowels regular but lately the MiraLax in addition to milk of magnesia and enemas have not helped constipation. No abdominal pain. Etiology for acute on chronic obstipation not clear. Patient denies starting any new medication. No dietary changes. He does not have abdominal pain but does mention intermittent nausea and vomiting over the last few weeks.  Current Medications, Allergies, Past Medical History, Past Surgical History, Family History and Social History were reviewed in [facility] electronic medical record.   Physical Exam: General: Well developed , black male in no acute distress Head: Normocephalic and atraumatic Eyes:  sclerae anicteric, conjunctiva pink  Ears: Normal auditory acuity Lungs: Decreased breath sounds at the bilateral bases Heart: Regular rate and rhythm Abdomen: Limited exam, patient in wheelchair. He has hemiparesis from a stroke. Abdomen is soft , non tender and non distended. Normal bowel sounds  Musculoskeletal: Symmetrical with no gross deformities  Extremities: No edema  Neurological: Alert oriented x 4, grossly nonfocal Psychological:  Alert and cooperative. Normal mood and affect  Assessment and Recommendations: 1. 77-year-old male with acute on chronic constipation. Suspect functional constipation. He has been taking MiraLax, and milk of magnesium. Patient lives at [facility] rehabilitation, staff has been giving him enemas over the last few days. I gave patient samples of Linzess to take  every day for the next 7-10 days. In addition I gave him samples of Kristalose to take twice daily for the next 3-4 days. Hopefully these medications will jump start his bowels .  2. nausea and vomiting, intermittent over the last 3 weeks. While this could be secondary to constipation, need to consider nausea and vomiting being secondary to #3.   3. 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 Partial occlusion of the lumen by this large polyp. Polyp biopsied but not removed for unclear reasons (possibly because of slow growth rate/patient's comorbidities). Patient now presenting with intermittent nausea and vomiting. Need to rule out further luminal occlusion by this giant polyp. For further evaluation patient will be scheduled for upper endoscopy with possible stent placement.The benefits, risks, and potential complications of EGD with possible biopsies and stenting were discussed with the patient and he agrees to proceed.   4. family history of colon cancer and father. Patient is up to date on his screening colonoscopies, last colonoscopy February 2011.  5. history of CVA/ hemiparesis   

## 2012-01-10 NOTE — Interval H&P Note (Signed)
History and Physical Interval Note:  01/10/2012 12:21 PM  Tyler Beard  has presented today for surgery, with the diagnosis of Nausea with vomiting [787.01] Constipation [564.00]  The various methods of treatment have been discussed with the patient and family. After consideration of risks, benefits and other options for treatment, the patient has consented to  Procedure(s) (LRB) with comments: ESOPHAGOGASTRODUODENOSCOPY (EGD) (N/A) DUODENAL STENT PLACEMENT (N/A) as a surgical intervention .  The patient's history has been reviewed, patient examined, no change in status, stable for surgery.  I have reviewed the patient's chart and labs.  Questions were answered to the patient's satisfaction.     The recent H&P (dated *12/30/11**) was reviewed, the patient was examined and there is no change in the patients condition since that H&P was completed.   Tyler Beard  01/10/2012, 12:21 PM   Tyler Beard

## 2012-01-10 NOTE — Anesthesia Preprocedure Evaluation (Addendum)
Anesthesia Evaluation  Patient identified by MRN, date of birth, ID band Patient awake    Reviewed: Allergy & Precautions, H&P , NPO status , Patient's Chart, lab work & pertinent test results, reviewed documented beta blocker date and time   Airway Mallampati: II TM Distance: >3 FB Neck ROM: full    Dental  (+) Edentulous Upper and Edentulous Lower   Pulmonary  breath sounds clear to auscultation  Pulmonary exam normal       Cardiovascular Exercise Tolerance: Good hypertension, Rhythm:regular Rate:Normal     Neuro/Psych PSYCHIATRIC DISORDERS Depression Left hemiparesis. CVA, Residual Symptoms    GI/Hepatic negative GI ROS, Neg liver ROS, GERD-  Medicated,  Endo/Other  negative endocrine ROS  Renal/GU negative Renal ROS  negative genitourinary   Musculoskeletal   Abdominal (+) + obese,   Peds  Hematology negative hematology ROS (+)   Anesthesia Other Findings   Reproductive/Obstetrics negative OB ROS                        Anesthesia Physical Anesthesia Plan  ASA: III  Anesthesia Plan: MAC   Post-op Pain Management:    Induction:   Airway Management Planned:   Additional Equipment:   Intra-op Plan:   Post-operative Plan:   Informed Consent: I have reviewed the patients History and Physical, chart, labs and discussed the procedure including the risks, benefits and alternatives for the proposed anesthesia with the patient or authorized representative who has indicated his/her understanding and acceptance.   Dental Advisory Given  Plan Discussed with: CRNA  Anesthesia Plan Comments:         Anesthesia Quick Evaluation

## 2012-01-10 NOTE — Op Note (Signed)
[facility] [address] [place], [address]   ENDOSCOPY PROCEDURE REPORT  PATIENT: Tyler Beard, Tyler Beard  MR#: [id number] BIRTHDATE: 01/24/1935 , 77  yrs. old GENDER: Male ENDOSCOPIST: Louis Meckel, MD REFERRED BY:  Karlene Einstein, M.D. PROCEDURE DATE:  01/10/2012 PROCEDURE:  EGD w/ biopsy ASA CLASS:     Class III INDICATIONS:  Vomiting.  history of duodenal mass MEDICATIONS: MAC sedation, administered by CRNA TOPICAL ANESTHETIC:  DESCRIPTION OF PROCEDURE: After the risks benefits and alternatives of the procedure were thoroughly explained, informed consent was obtained.  The    endoscope was introduced through the mouth and advanced to the third portion of the duodenum. Without limitations. The instrument was slowly withdrawn as the mucosa was fully examined.      Beginning in the second portion of the duodenum extending at least 6-7 cm is a circumferential exophytic friable mass.  9.8 mm gastroscope easily passes through the area beyond the tumor. Multiple biopsies were taken.  There was a diverticulum in the third portion the duodenum.  There is a 3 cm sliding hiatal hernia.  The remainder of the exam including esophagus and stomach were normal.  Retroflexed views revealed no abnormalities.     The scope was then withdrawn from the patient and the procedure completed.  COMPLICATIONS: There were no complications. ENDOSCOPIC IMPRESSION: enlarging duodenal mass/no obstruction by endoscopy or prior upper GI series  RECOMMENDATIONS: Expactant  therapy. If patient developes signs of gastric outlet obstruction would consider placement of a duodenal stent REPEAT EXAM:  eSigned:  Louis Meckel, MD 01/10/2012 1:26 PM   CC:

## 2012-01-10 NOTE — Anesthesia Postprocedure Evaluation (Signed)
  Anesthesia Post-op Note  Patient: Tyler Beard  Procedure(s) Performed: Procedure(s) (LRB): ESOPHAGOGASTRODUODENOSCOPY (EGD) (N/A)  Patient Location: PACU  Anesthesia Type: MAC  Level of Consciousness: awake and alert   Airway and Oxygen Therapy: Patient Spontanous Breathing  Post-op Pain: mild  Post-op Assessment: Post-op Vital signs reviewed, Patient's Cardiovascular Status Stable, Respiratory Function Stable, Patent Airway and No signs of Nausea or vomiting  Last Vitals:  Filed Vitals:   01/10/12 1328  BP: 111/74  Pulse: 94  Temp:   Resp: 9    Post-op Vital Signs: stable   Complications: No apparent anesthesia complications

## 2012-01-11 ENCOUNTER — Encounter (HOSPITAL_COMMUNITY): Payer: Self-pay | Admitting: Gastroenterology

## 2012-01-17 ENCOUNTER — Encounter: Payer: Self-pay | Admitting: Gastroenterology

## 2012-01-21 ENCOUNTER — Telehealth: Payer: Self-pay | Admitting: Gastroenterology

## 2012-01-21 NOTE — Telephone Encounter (Signed)
Left message to call back  

## 2012-01-24 NOTE — Telephone Encounter (Signed)
Discussed with pts wife the letter that was mailed out to pt.

## 2012-02-07 ENCOUNTER — Telehealth: Payer: Self-pay | Admitting: Nurse Practitioner

## 2012-02-07 NOTE — Telephone Encounter (Signed)
Marthenia Rolling at [facility] advised Linzess wasn't covered by [employer].  I asked Willette Cluster ACNP if we could try one month of Amitiza, 24 mcg twice daily.  She told me that would be fine. She wanted me to ask Amy Esterwood PA-C if Valinda Hoar is recommended for men. The studies were on women.  Amy said our practice has prescribed the Amitiza for men.  Marthenia Rolling said she would have the MD there order the Amitiza for one month.  I told her to let me know if his insurance doesn't want to cover that.  I also told her to let us know if the Amitiza isn't helpful for him regarding his constipation.  ( The Linzess did help him.).

## 2012-05-11 DIAGNOSIS — D378 Neoplasm of uncertain behavior of other specified digestive organs: Secondary | ICD-10-CM

## 2012-05-11 DIAGNOSIS — I739 Peripheral vascular disease, unspecified: Secondary | ICD-10-CM

## 2012-05-11 DIAGNOSIS — K5900 Constipation, unspecified: Secondary | ICD-10-CM

## 2012-05-11 DIAGNOSIS — I119 Hypertensive heart disease without heart failure: Secondary | ICD-10-CM

## 2012-05-11 DIAGNOSIS — K264 Chronic or unspecified duodenal ulcer with hemorrhage: Secondary | ICD-10-CM

## 2012-05-11 DIAGNOSIS — D371 Neoplasm of uncertain behavior of stomach: Secondary | ICD-10-CM

## 2012-05-11 DIAGNOSIS — E782 Mixed hyperlipidemia: Secondary | ICD-10-CM

## 2012-05-11 DIAGNOSIS — D375 Neoplasm of uncertain behavior of rectum: Secondary | ICD-10-CM

## 2012-06-08 DIAGNOSIS — I6990 Unspecified sequelae of unspecified cerebrovascular disease: Secondary | ICD-10-CM

## 2012-06-08 DIAGNOSIS — E7800 Pure hypercholesterolemia, unspecified: Secondary | ICD-10-CM

## 2012-06-08 DIAGNOSIS — K219 Gastro-esophageal reflux disease without esophagitis: Secondary | ICD-10-CM

## 2012-06-08 DIAGNOSIS — I69959 Hemiplegia and hemiparesis following unspecified cerebrovascular disease affecting unspecified side: Secondary | ICD-10-CM

## 2012-06-08 DIAGNOSIS — K5900 Constipation, unspecified: Secondary | ICD-10-CM

## 2012-06-08 DIAGNOSIS — F339 Major depressive disorder, recurrent, unspecified: Secondary | ICD-10-CM

## 2012-07-06 DIAGNOSIS — E7800 Pure hypercholesterolemia, unspecified: Secondary | ICD-10-CM

## 2012-07-06 DIAGNOSIS — I69959 Hemiplegia and hemiparesis following unspecified cerebrovascular disease affecting unspecified side: Secondary | ICD-10-CM

## 2012-07-06 DIAGNOSIS — K5900 Constipation, unspecified: Secondary | ICD-10-CM

## 2012-07-06 DIAGNOSIS — I6990 Unspecified sequelae of unspecified cerebrovascular disease: Secondary | ICD-10-CM

## 2012-07-06 DIAGNOSIS — K219 Gastro-esophageal reflux disease without esophagitis: Secondary | ICD-10-CM

## 2012-07-06 DIAGNOSIS — F339 Major depressive disorder, recurrent, unspecified: Secondary | ICD-10-CM

## 2012-07-31 ENCOUNTER — Ambulatory Visit: Payer: PRIVATE HEALTH INSURANCE | Admitting: Gastroenterology

## 2012-08-08 DIAGNOSIS — I6990 Unspecified sequelae of unspecified cerebrovascular disease: Secondary | ICD-10-CM

## 2012-08-08 DIAGNOSIS — F339 Major depressive disorder, recurrent, unspecified: Secondary | ICD-10-CM

## 2012-08-08 DIAGNOSIS — K219 Gastro-esophageal reflux disease without esophagitis: Secondary | ICD-10-CM

## 2012-08-08 DIAGNOSIS — I69959 Hemiplegia and hemiparesis following unspecified cerebrovascular disease affecting unspecified side: Secondary | ICD-10-CM

## 2012-08-08 DIAGNOSIS — E7800 Pure hypercholesterolemia, unspecified: Secondary | ICD-10-CM

## 2012-08-08 DIAGNOSIS — K5900 Constipation, unspecified: Secondary | ICD-10-CM

## 2012-08-29 ENCOUNTER — Non-Acute Institutional Stay (SKILLED_NURSING_FACILITY): Payer: PRIVATE HEALTH INSURANCE | Admitting: Internal Medicine

## 2012-08-29 DIAGNOSIS — K219 Gastro-esophageal reflux disease without esophagitis: Secondary | ICD-10-CM

## 2012-08-29 DIAGNOSIS — I6350 Cerebral infarction due to unspecified occlusion or stenosis of unspecified cerebral artery: Secondary | ICD-10-CM

## 2012-08-29 DIAGNOSIS — E7800 Pure hypercholesterolemia, unspecified: Secondary | ICD-10-CM | POA: Insufficient documentation

## 2012-08-29 DIAGNOSIS — K5900 Constipation, unspecified: Secondary | ICD-10-CM

## 2012-08-29 NOTE — Progress Notes (Signed)
PROGRESS NOTE  DATE: 08/29/2012  FACILITY: Nursing Home Location: [facility]  LEVEL OF CARE: SNF (31)  Routine Visit  CHIEF COMPLAINT:  Manage hyperlipidemia, CVA and constipation  HISTORY OF PRESENT ILLNESS:  CVA: The patient's CVA remains stable.  Patient denies new neurologic symptoms such as numbness, tingling, weakness, speech difficulties or visual disturbances.  No complications reported from the medications currently being used. Patient has left-sided hemiparesis.  CONSTIPATION: The constipation remains stable. No complications from the medications presently being used. Patient denies ongoing constipation, abdominal pain, nausea or vomiting.  HYPERLIPIDEMIA: No complications from the medications presently being used. Last fasting lipid panel showed : HDL 31 otherwise fasting lipid panel normal in 5/14.  REASSESSMENT OF ONGOING PROBLEM(S):  PAST MEDICAL HISTORY : Reviewed.  No changes.  CURRENT MEDICATIONS: Reviewed per [facility]  REVIEW OF SYSTEMS:  GENERAL: no change in appetite, no fatigue, no weight changes, no fever, chills or weakness RESPIRATORY: no cough, SOB, DOE, wheezing, hemoptysis CARDIAC: no chest pain, edema or palpitations GI: no abdominal pain, diarrhea, constipation, heart burn, nausea or vomiting  PHYSICAL EXAMINATION  VS:  T 98.1      P 68      RR 16      BP 102/74     POX %     WT (Lb) 192  GENERAL: no acute distress, normal body habitus EYES: conjunctivae normal, sclerae normal, normal eye lids NECK: supple, trachea midline, no neck masses, no thyroid tenderness, no thyromegaly LYMPHATICS: no LAN in the neck, no supraclavicular LAN RESPIRATORY: breathing is even & unlabored, BS CTAB CARDIAC: RRR, no murmur,no extra heart sounds, no edema GI: abdomen soft, normal BS, no masses, no tenderness, no hepatomegaly, no splenomegaly PSYCHIATRIC: the patient is alert & oriented to person, affect & behavior  appropriate  LABS/RADIOLOGY:  3/14 hemoglobin 10.5, MCV 71.2 otherwise CBC normal, glucose 111 otherwise BMP normal 2/14 TSH 2.375, hemoglobin A1c 6.4 12/13 CMP normal  ASSESSMENT/PLAN:  Hyperlipidemia-well controlled. DC Zocor. Check lipid panel in 3 months. CVA -- stable. Constipation-well-controlled. GERD-stable. Hypertension-well-controlled. Major depression-Zoloft was discontinued. Patient stable.   CPT CODE: [address]

## 2012-10-03 ENCOUNTER — Non-Acute Institutional Stay (SKILLED_NURSING_FACILITY): Payer: PRIVATE HEALTH INSURANCE | Admitting: Internal Medicine

## 2012-10-03 DIAGNOSIS — K5900 Constipation, unspecified: Secondary | ICD-10-CM

## 2012-10-03 DIAGNOSIS — E7800 Pure hypercholesterolemia, unspecified: Secondary | ICD-10-CM

## 2012-10-03 DIAGNOSIS — K219 Gastro-esophageal reflux disease without esophagitis: Secondary | ICD-10-CM

## 2012-10-03 DIAGNOSIS — I6350 Cerebral infarction due to unspecified occlusion or stenosis of unspecified cerebral artery: Secondary | ICD-10-CM

## 2012-10-07 NOTE — Progress Notes (Signed)
PROGRESS NOTE  DATE: 10-03-12  FACILITY: Nursing Home Location: [facility]  LEVEL OF CARE: SNF (31)  Routine Visit  CHIEF COMPLAINT:  Manage hyperlipidemia, CVA and constipation  HISTORY OF PRESENT ILLNESS:  CVA: The patient's CVA remains stable.  Patient denies new neurologic symptoms such as numbness, tingling, weakness, speech difficulties or visual disturbances.  No complications reported from the medications currently being used. Patient has left-sided hemiparesis.  CONSTIPATION: The constipation remains stable. No complications from the medications presently being used. Patient denies ongoing constipation, abdominal pain, nausea or vomiting.  HYPERLIPIDEMIA: No complications from the medications presently being used. Last fasting lipid panel showed : HDL 31 otherwise fasting lipid panel normal in 5/14.  REASSESSMENT OF ONGOING PROBLEM(S):  PAST MEDICAL HISTORY : Reviewed.  No changes.  CURRENT MEDICATIONS: Reviewed per [facility]  REVIEW OF SYSTEMS:  GENERAL: no change in appetite, no fatigue, no weight changes, no fever, chills or weakness RESPIRATORY: no cough, SOB, DOE, wheezing, hemoptysis CARDIAC: no chest pain, edema or palpitations GI: no abdominal pain, diarrhea, constipation, heart burn, nausea or vomiting  PHYSICAL EXAMINATION  VS:  T 97     P 87      RR 20      BP 124/85     POX %     WT (Lb) 195.6  GENERAL: no acute distress, normal body habitus EYES: conjunctivae normal, sclerae normal, normal eye lids NECK: supple, trachea midline, no neck masses, no thyroid tenderness, no thyromegaly LYMPHATICS: no LAN in the neck, no supraclavicular LAN RESPIRATORY: breathing is even & unlabored, BS CTAB CARDIAC: RRR, no murmur,no extra heart sounds, no edema GI: abdomen soft, normal BS, no masses, no tenderness, no hepatomegaly, no splenomegaly PSYCHIATRIC: the patient is alert & oriented to person, affect & behavior  appropriate  LABS/RADIOLOGY:  8-14 hemoglobin 9, MCV 70.7 otherwise CBC normal 7-14 glucose 123 otherwise BMP normal, hemoglobin A1c 6.2, liver profile normal  3/14 hemoglobin 10.5, MCV 71.2 otherwise CBC normal, glucose 111 otherwise BMP normal 2/14 TSH 2.375, hemoglobin A1c 6.4 12/13 CMP normal  ASSESSMENT/PLAN:  Hyperlipidemia-well controlled. DC Zocor. Check lipid panel in 2 months. CVA -- stable. Constipation-well-controlled. GERD-stable. Hypertension-well-controlled. Anemia-iron was started. Recheck hemoglobin in one month pending.  CPT CODE: [address]

## 2012-11-07 ENCOUNTER — Non-Acute Institutional Stay (SKILLED_NURSING_FACILITY): Payer: PRIVATE HEALTH INSURANCE | Admitting: Internal Medicine

## 2012-11-07 DIAGNOSIS — E7800 Pure hypercholesterolemia, unspecified: Secondary | ICD-10-CM

## 2012-11-07 DIAGNOSIS — K219 Gastro-esophageal reflux disease without esophagitis: Secondary | ICD-10-CM

## 2012-11-07 DIAGNOSIS — I6350 Cerebral infarction due to unspecified occlusion or stenosis of unspecified cerebral artery: Secondary | ICD-10-CM

## 2012-11-07 DIAGNOSIS — K5900 Constipation, unspecified: Secondary | ICD-10-CM

## 2012-11-07 NOTE — Progress Notes (Signed)
PROGRESS NOTE  DATE: 11-07-12  FACILITY: Nursing Home Location: [facility]  LEVEL OF CARE: SNF (31)  Routine Visit  CHIEF COMPLAINT:  Manage hyperlipidemia, CVA and constipation  HISTORY OF PRESENT ILLNESS:  CVA: The patient's CVA remains stable.  Patient denies new neurologic symptoms such as numbness, tingling, weakness, speech difficulties or visual disturbances.  No complications reported from the medications currently being used. Patient has left-sided hemiparesis.  CONSTIPATION: The constipation remains stable. No complications from the medications presently being used. Patient denies ongoing constipation, abdominal pain, nausea or vomiting.  HYPERLIPIDEMIA: No complications from the medications presently being used. Last fasting lipid panel showed : HDL 31 otherwise fasting lipid panel normal in 5/14, in 9-14 triglycerides 197, HDL 36, total cholesterol 409, LDL 81.  REASSESSMENT OF ONGOING PROBLEM(S):  PAST MEDICAL HISTORY : Reviewed.  No changes.  CURRENT MEDICATIONS: Reviewed per [facility]  REVIEW OF SYSTEMS:  GENERAL: no change in appetite, no fatigue, no weight changes, no fever, chills or weakness RESPIRATORY: no cough, SOB, DOE, wheezing, hemoptysis CARDIAC: no chest pain, edema or palpitations GI: no abdominal pain, diarrhea, constipation, heart burn, nausea or vomiting  PHYSICAL EXAMINATION  VS:  T 97.6     P 80     RR 20      BP 127/81     POX %     WT (Lb) 192.8  GENERAL: no acute distress, normal body habitus EYES: conjunctivae normal, sclerae normal, normal eye lids NECK: supple, trachea midline, no neck masses, no thyroid tenderness, no thyromegaly LYMPHATICS: no LAN in the neck, no supraclavicular LAN RESPIRATORY: breathing is even & unlabored, BS CTAB CARDIAC: RRR, no murmur,no extra heart sounds, no edema GI: abdomen soft, normal BS, no masses, no tenderness, no hepatomegaly, no splenomegaly PSYCHIATRIC: the patient is alert  & oriented to person, affect & behavior appropriate  LABS/RADIOLOGY:  9-14 hemoglobin 11.9, MCV 77.6 otherwise CBC normal  8-14 hemoglobin 9, MCV 70.7 otherwise CBC normal, serum iron level 17, TIBC 317, percent saturation 5, vitamin B12 level 395, folate greater than 20, ferritin 8 7-14 glucose 123 otherwise BMP normal, hemoglobin A1c 6.2, liver profile normal  3/14 hemoglobin 10.5, MCV 71.2 otherwise CBC normal, glucose 111 otherwise BMP normal 2/14 TSH 2.375, hemoglobin A1c 6.4 12/13 CMP normal  ASSESSMENT/PLAN:  Hyperlipidemia-well controlled.  CVA -- stable. Constipation-well-controlled. GERD-stable. Hypertension-well-controlled. Iron deficiency anemia-hemoglobin improved. Continue iron  CPT CODE: 81191

## 2012-12-11 ENCOUNTER — Non-Acute Institutional Stay (SKILLED_NURSING_FACILITY): Payer: PRIVATE HEALTH INSURANCE | Admitting: Internal Medicine

## 2012-12-11 DIAGNOSIS — I6350 Cerebral infarction due to unspecified occlusion or stenosis of unspecified cerebral artery: Secondary | ICD-10-CM

## 2012-12-11 DIAGNOSIS — K5900 Constipation, unspecified: Secondary | ICD-10-CM

## 2012-12-11 DIAGNOSIS — K219 Gastro-esophageal reflux disease without esophagitis: Secondary | ICD-10-CM

## 2012-12-11 DIAGNOSIS — E7800 Pure hypercholesterolemia, unspecified: Secondary | ICD-10-CM

## 2012-12-11 NOTE — Progress Notes (Signed)
PROGRESS NOTE  DATE: 12-11-12  FACILITY: Nursing Home Location: [facility]  LEVEL OF CARE: SNF (31)  Routine Visit  CHIEF COMPLAINT:  Manage hyperlipidemia, CVA and constipation  HISTORY OF PRESENT ILLNESS:  CVA: The patient's CVA remains stable.  Patient denies new neurologic symptoms such as numbness, tingling, weakness, speech difficulties or visual disturbances.  No complications reported from the medications currently being used. Patient has left-sided hemiparesis.  CONSTIPATION: The constipation remains stable. No complications from the medications presently being used. Patient denies ongoing constipation, abdominal pain, nausea or vomiting.  HYPERLIPIDEMIA: No complications from the medications presently being used. Last fasting lipid panel showed : HDL 31 otherwise fasting lipid panel normal in 5/14, in 9-14 triglycerides 197, HDL 36, total cholesterol 811, LDL 81, in 10/14 triglycerides 202 otherwise fasting lipid panel normal.  REASSESSMENT OF ONGOING PROBLEM(S):  PAST MEDICAL HISTORY : Reviewed.  No changes.  CURRENT MEDICATIONS: Reviewed per [facility]  REVIEW OF SYSTEMS:  GENERAL: no change in appetite, no fatigue, no weight changes, no fever, chills or weakness RESPIRATORY: no cough, SOB, DOE, wheezing, hemoptysis CARDIAC: no chest pain, edema or palpitations GI: no abdominal pain, diarrhea, constipation, heart burn, nausea or vomiting  PHYSICAL EXAMINATION  VS:  T 97.1    P 74     RR 22      BP 123/76    POX %     WT (Lb) 195.2  GENERAL: no acute distress, normal body habitus EYES: conjunctivae normal, sclerae normal, normal eye lids NECK: supple, trachea midline, no neck masses, no thyroid tenderness, no thyromegaly LYMPHATICS: no LAN in the neck, no supraclavicular LAN RESPIRATORY: breathing is even & unlabored, BS CTAB CARDIAC: RRR, no murmur,no extra heart sounds, no edema GI: abdomen soft, normal BS, no masses, no tenderness, no  hepatomegaly, no splenomegaly PSYCHIATRIC: the patient is alert & oriented to person, affect & behavior appropriate  LABS/RADIOLOGY:  9-14 hemoglobin 11.9, MCV 77.6 otherwise CBC normal  8-14 hemoglobin 9, MCV 70.7 otherwise CBC normal, serum iron level 17, TIBC 317, percent saturation 5, vitamin B12 level 395, folate greater than 20, ferritin 8 7-14 glucose 123 otherwise BMP normal, hemoglobin A1c 6.2, liver profile normal  3/14 hemoglobin 10.5, MCV 71.2 otherwise CBC normal, glucose 111 otherwise BMP normal 2/14 TSH 2.375, hemoglobin A1c 6.4 12/13 CMP normal  ASSESSMENT/PLAN:  Hyperlipidemia-well controlled.  CVA -- stable. Constipation-well-controlled. GERD-stable. Hypertension-well-controlled. Iron deficiency anemia-hemoglobin improved. Continue iron Depression-Zoloft was started  CPT CODE: 91478

## 2012-12-25 ENCOUNTER — Non-Acute Institutional Stay (SKILLED_NURSING_FACILITY): Payer: PRIVATE HEALTH INSURANCE | Admitting: Internal Medicine

## 2012-12-25 DIAGNOSIS — K5900 Constipation, unspecified: Secondary | ICD-10-CM

## 2012-12-25 DIAGNOSIS — D509 Iron deficiency anemia, unspecified: Secondary | ICD-10-CM

## 2012-12-25 DIAGNOSIS — K219 Gastro-esophageal reflux disease without esophagitis: Secondary | ICD-10-CM

## 2012-12-25 DIAGNOSIS — I6350 Cerebral infarction due to unspecified occlusion or stenosis of unspecified cerebral artery: Secondary | ICD-10-CM

## 2012-12-25 DIAGNOSIS — E785 Hyperlipidemia, unspecified: Secondary | ICD-10-CM

## 2013-01-07 DIAGNOSIS — E785 Hyperlipidemia, unspecified: Secondary | ICD-10-CM | POA: Insufficient documentation

## 2013-01-07 NOTE — Progress Notes (Signed)
Patient ID: Tyler Beard, male   DOB: November 18, 1934, 78 y.o.   MRN: [id number]  [facility]  No Known Allergies  Chief Complaint  Patient presents with  . Medical Managment of Chronic Issues    new admit to facility for long term care   HPI 78 y/o male patient is here for long term care. he was residing in anther nursing home facility prior to this. he has CVA with left sided hemiplegia and also has aphasia. He has GERD and hyperlipidemia and is at high aspiration risk. He was seen in his room today. He is in no distress. He is alert and oriented and denies any complaints. No concerns from the staff  Review of Systems  Constitutional: Negative for fever, chills, weight loss, malaise/fatigue and diaphoresis.  HENT: Negative for congestion, hearing loss and sore throat.   Eyes: Negative for blurred vision, double vision and discharge.  Respiratory: Negative for cough, sputum production, shortness of breath and wheezing.   Cardiovascular: Negative for chest pain, palpitations, orthopnea and leg swelling.  Gastrointestinal: Negative for heartburn, nausea, vomiting, abdominal pain, diarrhea and constipation.  Genitourinary: Negative for dysuria, urgency, frequency and flank pain.  Musculoskeletal: Negative for back pain, falls, joint pain and myalgias.  Skin: Negative for itching and rash.  Neurological: Negative for dizziness, tingling, focal weakness and headaches.  Psychiatric/Behavioral: Negative for depression and memory loss. The patient is not nervous/anxious.     Past Medical History  Diagnosis Date  . Hypertension   . Depression   . GERD (gastroesophageal reflux disease)   . Hypercholesterolemia   . Glaucoma   . Cataracts, bilateral   . Cerebrovascular accident 2007   Past Surgical History  Procedure Laterality Date  . Section of villous adenoma of colon    . Cataract surgery      bilateral  . Aphagia    . Esophagogastroduodenoscopy  01/10/2012    Procedure:  ESOPHAGOGASTRODUODENOSCOPY (EGD);  Surgeon: Louis Meckel, MD;  Location: [facility];  Service: Endoscopy;  Laterality: N/A;   Family History  Problem Relation Age of Onset  . Cancer Father   . Cancer Mother   . Cancer Maternal Aunt   . Cancer Sister    History   Social History  . Marital Status: Married    Spouse Name: N/A    Number of Children: N/A  . Years of Education: N/A   Occupational History  . Not on file.   Social History Main Topics  . Smoking status: Never Smoker   . Smokeless tobacco: Never Used  . Alcohol Use: No  . Drug Use: No  . Sexual Activity: Not on file   Other Topics Concern  . Not on file   Social History Narrative  . No narrative on file   Medication reviewed. See [facility]  Physical exam BP 135/79  Pulse 81  Temp(Src) 98.2 F (36.8 C)  Resp 18  SpO2 99%  GENERAL: no acute distress, normal body habitus EYES: conjunctivae normal, sclerae normal, normal eye lids NECK: supple, trachea midline, no neck masses, no thyroid tenderness, no thyromegaly, no lymphadenopathy RESPIRATORY: breathing stable, CTAB CARDIAC: RRR, no murmur,no extra heart sounds, no edema GI: abdomen soft, normal BS, no masses, no tenderness, no hepatomegaly, no splenomegaly EXTREMITIES: left sided weakness, on wheelchair NEURO: no new focal deficit, left sided hemiparesis, has slurred speech PSYCHIATRIC: alert and oriented to person  Labs from other facility reviewed  Assessment/plan  CVA- no new neurologic symptoms, left sided hemiparesis present,  will have him work with therapy team for restorative training. On lidoderm patch and prn tylenol for pain  GERD- symptoms are stable, continue carafate and protonix for now  constipation- The constipation remains stable. No complications from the medications presently being used. Monitor clinically and continue amitiza with miralax for now  Hyperlipidemia- ldl at goal on last lab review. Off statin currently. Recheck lipid  panel in 6 months and consider statin if ldl not at goal  Anemia- stable, monitor clinically  Labs- routine cbc, cmp, lipid panel  Family/ staff Communication: reviewed care plan with patient and nursing supervisor

## 2013-01-25 ENCOUNTER — Non-Acute Institutional Stay (SKILLED_NURSING_FACILITY): Payer: PRIVATE HEALTH INSURANCE | Admitting: Internal Medicine

## 2013-01-25 DIAGNOSIS — K219 Gastro-esophageal reflux disease without esophagitis: Secondary | ICD-10-CM

## 2013-01-25 DIAGNOSIS — D509 Iron deficiency anemia, unspecified: Secondary | ICD-10-CM

## 2013-01-25 DIAGNOSIS — I6350 Cerebral infarction due to unspecified occlusion or stenosis of unspecified cerebral artery: Secondary | ICD-10-CM

## 2013-01-25 NOTE — Progress Notes (Signed)
Patient ID: Tyler Beard, male   DOB: Dec 11, 1934, 78 y.o.   MRN: [id number]     [facility]  No Known Allergies  HPI 78 y/o male patient is here for long term care. he has CVA with left sided hemiplegia and also has aphasia. He has GERD and hyperlipidemia and is at high aspiration risk. He is alert and oriented and denies any complaints. No concerns from the staff. He has been working with therapy  Review of Systems  Constitutional: Negative for fever, chills, weight loss, malaise/fatigue and diaphoresis.  HENT: Negative for congestion, hearing loss and sore throat.   Eyes: Negative for blurred vision, double vision and discharge.  Respiratory: Negative for cough, sputum production, shortness of breath and wheezing.   Cardiovascular: Negative for chest pain, palpitations, orthopnea and leg swelling.  Gastrointestinal: Negative for heartburn, nausea, vomiting, abdominal pain, diarrhea and constipation.  Genitourinary: Negative for dysuria, urgency, frequency and flank pain.  Musculoskeletal: Negative for back pain, falls, joint pain and myalgias.  Skin: Negative for itching and rash.  Neurological: Negative for dizziness, tingling, focal weakness and headaches.  Psychiatric/Behavioral: Negative for depression and memory loss. The patient is not nervous/anxious.    Past Medical History  Diagnosis Date  . Hypertension   . Depression   . GERD (gastroesophageal reflux disease)   . Hypercholesterolemia   . Glaucoma   . Cataracts, bilateral   . Cerebrovascular accident 2007   Current Outpatient Prescriptions on File Prior to Visit  Medication Sig Dispense Refill  . polyethylene glycol (MIRALAX / GLYCOLAX) packet Take 17 g by mouth daily as needed. constipation      . sertraline (ZOLOFT) 25 MG tablet Take 12.5 mg by mouth daily.        No current facility-administered medications on file prior to visit.    Medication reviewed. See [facility]  Physical exam BP  117/71  Pulse 67  Temp(Src) 97.2 F (36.2 C)  Resp 17  SpO2 96%  GENERAL: no acute distress, normal body habitus EYES: conjunctivae normal, sclerae normal, normal eye lids NECK: supple, trachea midline, no neck masses, no thyroid tenderness, no thyromegaly, no lymphadenopathy RESPIRATORY: breathing stable, CTAB CARDIAC: RRR, no murmur,no extra heart sounds, no edema GI: abdomen soft, normal BS, no masses, no tenderness, no hepatomegaly, no splenomegaly EXTREMITIES: left sided weakness, on wheelchair NEURO: no new focal deficit, left sided hemiparesis, has slurred speech PSYCHIATRIC: alert and oriented to person  Labs- 12/25/12 na 137, k 4.6, glu 117, bun 9, cr 1, ca 9.4, wbc 5, hb 14.2, hct 46.1, plt 261  Assessment/plan  CVA- no new neurologic symptoms, left sided hemiparesis present, will have him work with therapy team for restorative training. On lidoderm patch and prn tylenol for pain. Not on statin. Check lipid panel and if ldl > 100, consider statin for mortality benefit  GERD- symptoms are stable, continue carafate and protonix for now  Anemia- stable, monitor clinically  Labs- lipid panel, a1c, vit d  Family/ staff Communication: reviewed care plan with patient and nursing supervisor

## 2013-03-05 ENCOUNTER — Encounter: Payer: Self-pay | Admitting: Internal Medicine

## 2013-03-05 ENCOUNTER — Non-Acute Institutional Stay (SKILLED_NURSING_FACILITY): Payer: PRIVATE HEALTH INSURANCE | Admitting: Internal Medicine

## 2013-03-05 DIAGNOSIS — K449 Diaphragmatic hernia without obstruction or gangrene: Secondary | ICD-10-CM

## 2013-03-05 DIAGNOSIS — IMO0002 Reserved for concepts with insufficient information to code with codable children: Secondary | ICD-10-CM

## 2013-03-05 DIAGNOSIS — I6350 Cerebral infarction due to unspecified occlusion or stenosis of unspecified cerebral artery: Secondary | ICD-10-CM

## 2013-03-05 DIAGNOSIS — F329 Major depressive disorder, single episode, unspecified: Secondary | ICD-10-CM

## 2013-03-05 DIAGNOSIS — E785 Hyperlipidemia, unspecified: Secondary | ICD-10-CM

## 2013-03-05 DIAGNOSIS — E559 Vitamin D deficiency, unspecified: Secondary | ICD-10-CM

## 2013-03-05 DIAGNOSIS — K5900 Constipation, unspecified: Secondary | ICD-10-CM

## 2013-03-05 DIAGNOSIS — F3289 Other specified depressive episodes: Secondary | ICD-10-CM

## 2013-03-05 NOTE — Progress Notes (Signed)
Patient ID: Tyler Beard, male   DOB: 30-Jun-1934, 79 y.o.   MRN: [id number]    [facility]  Chief Complaint  Patient presents with  . Medical Managment of Chronic Issues   No Known Allergies  HPI 79 y/o male patient is here for long term care. he has CVA with left sided hemiplegia and also has aphasia. He has GERD and hyperlipidemia and is at high aspiration risk. He is in no distress. He is alert and oriented and denies any complaints. No concerns from the staff  Review of Systems  Constitutional: Negative for fever, chills, weight loss, malaise/fatigue and diaphoresis.  HENT: Negative for congestion, hearing loss and sore throat.   Eyes: Negative for blurred vision, double vision and discharge.  Respiratory: Negative for cough, sputum production, shortness of breath and wheezing.   Cardiovascular: Negative for chest pain, palpitations, orthopnea and leg swelling.  Gastrointestinal: Negative for heartburn, nausea, vomiting, abdominal pain, diarrhea and constipation.  Genitourinary: Negative for dysuria, urgency, frequency and flank pain.  Musculoskeletal: Negative for back pain, falls, joint pain and myalgias.  Skin: Negative for itching and rash.  Neurological: Negative for dizziness, tingling, focal weakness and headaches.  Psychiatric/Behavioral: Negative for depression and memory loss. The patient is not nervous/anxious.    Medication reviewed. See [facility]  Past medical hx reviewed  Physical exam BP 130/89  Pulse 63  Temp(Src) 96.9 F (36.1 C)  Resp 18  SpO2 96%  GENERAL: no acute distress, normal body habitus EYES: conjunctivae normal, sclerae normal, normal eye lids NECK: supple, trachea midline, no neck masses, no thyroid tenderness, no thyromegaly, no lymphadenopathy RESPIRATORY: breathing stable, CTAB CARDIAC: RRR, no murmur,no extra heart sounds, no edema GI: abdomen soft, normal BS, no masses, no tenderness, no hepatomegaly, no  splenomegaly EXTREMITIES: left sided weakness, on wheelchair NEURO: no new focal deficit, left sided hemiparesis, has slurred speech PSYCHIATRIC: alert and oriented to person  Labs  01/26/13 t.chol 165, tg 134, ldl 105, hdl 33, a1c 6, vit d 18.43  Assessment/plan  Vitamin d def- continue vitamin d 2000 u 2 cap daily  hiatal hernia- tolerating carfate 1gm four times a day. Also to continue protonix 40 mg daily  Depression- stable at present. Continue zoloft 12.5 mg daily  Constipation- stable with amitiza, lactulose and miralax. Monitor clinically  CVA- stable with left sided hemiparesis present. On lidoderm patch and prn tylenol for pain. Off all bp meds. ldl 105 and goal is < 100.   GERD- symptoms are stable, continue carafate and protonix for now  Hyperlipidemia- ldl not at goal on last lab review. Will have him on lipitor 10 mg daily for mortality benefit  Lab- ck, lft in 3 month

## 2013-03-29 ENCOUNTER — Non-Acute Institutional Stay (SKILLED_NURSING_FACILITY): Payer: PRIVATE HEALTH INSURANCE | Admitting: Internal Medicine

## 2013-03-29 DIAGNOSIS — K449 Diaphragmatic hernia without obstruction or gangrene: Secondary | ICD-10-CM

## 2013-03-29 DIAGNOSIS — I6350 Cerebral infarction due to unspecified occlusion or stenosis of unspecified cerebral artery: Secondary | ICD-10-CM

## 2013-03-29 DIAGNOSIS — IMO0002 Reserved for concepts with insufficient information to code with codable children: Secondary | ICD-10-CM

## 2013-03-29 DIAGNOSIS — K219 Gastro-esophageal reflux disease without esophagitis: Secondary | ICD-10-CM

## 2013-03-29 DIAGNOSIS — F3289 Other specified depressive episodes: Secondary | ICD-10-CM

## 2013-03-29 DIAGNOSIS — E785 Hyperlipidemia, unspecified: Secondary | ICD-10-CM

## 2013-03-29 DIAGNOSIS — K5900 Constipation, unspecified: Secondary | ICD-10-CM

## 2013-03-29 DIAGNOSIS — E559 Vitamin D deficiency, unspecified: Secondary | ICD-10-CM

## 2013-03-29 DIAGNOSIS — F329 Major depressive disorder, single episode, unspecified: Secondary | ICD-10-CM

## 2013-03-29 NOTE — Progress Notes (Signed)
Patient ID: Tyler Beard, male   DOB: April 03, 1934, 79 y.o.   MRN: [id number]    [facility]  No Known Allergies  CC- routine visit  HPI 79 y/o male patient is here for long term care. he has CVA with left sided hemiplegia and also has aphasia. He has GERD and hyperlipidemia and is at high aspiration risk. He is in no distress. He is alert and oriented and denies any complaints. No concerns from the staff  Review of Systems   Constitutional: Negative for fever, chills, weight loss, malaise/fatigue and diaphoresis.   HENT: Negative for congestion, hearing loss and sore throat.    Eyes: Negative for blurred vision, double vision and discharge.   Respiratory: Negative for cough, sputum production, shortness of breath and wheezing.    Cardiovascular: Negative for chest pain, palpitations, orthopnea and leg swelling.   Gastrointestinal: Negative for heartburn, nausea, vomiting, abdominal pain, diarrhea and constipation.   Genitourinary: Negative for dysuria, urgency, frequency and flank pain.   Musculoskeletal: Negative for back pain, falls, joint pain and myalgias.   Skin: Negative for itching and rash.   Neurological: Negative for dizziness, tingling, focal weakness and headaches.   Psychiatric/Behavioral: Negative for depression and memory loss. The patient is not nervous/anxious.    Past Medical History  Diagnosis Date  . Hypertension   . Depression   . GERD (gastroesophageal reflux disease)   . Hypercholesterolemia   . Glaucoma   . Cataracts, bilateral   . Cerebrovascular accident 2007   Past Surgical History  Procedure Laterality Date  . Section of villous adenoma of colon    . Cataract surgery      bilateral  . Aphagia    . Esophagogastroduodenoscopy  01/10/2012    Procedure: ESOPHAGOGASTRODUODENOSCOPY (EGD);  Surgeon: Inda Castle, MD;  Location: [facility];  Service: Endoscopy;  Laterality: N/A;    Medication reviewed. See [facility]   Physical  exam BP 128/88  Pulse 76  Temp(Src) 98 F (36.7 C)  Resp 16  SpO2 96%  GENERAL: no acute distress, normal body habitus EYES: conjunctivae normal, sclerae normal, normal eye lids NECK: supple, trachea midline, no neck masses, no thyroid tenderness, no thyromegaly, no lymphadenopathy RESPIRATORY: breathing stable, CTAB CARDIAC: RRR, no murmur,no extra heart sounds, no edema GI: abdomen soft, normal BS, no masses, no tenderness, no hepatomegaly, no splenomegaly EXTREMITIES: left sided weakness, on wheelchair NEURO: no new focal deficit, left sided hemiparesis, has slurred speech PSYCHIATRIC: alert and oriented to person  Labs   01/26/13 t.chol 165, tg 134, ldl 105, hdl 33, a1c 6, vit d 18.43  Assessment/plan  Depression- stable at present. Continue zoloft 12.5 mg daily  hiatal hernia- tolerating carfate 1gm four times a day. Also to continue protonix 40 mg daily  Vitamin d def- continue vitamin d 2000 u 2 cap daily  Hyperlipidemia- continue lipitor 10 mg daily   Constipation- stable with senna-s, amitiza, lactulose and miralax. Monitor clinically  CVA- stable with left sided hemiparesis present. On lidoderm patch and prn tylenol for pain. Off all bp meds. Continue statin  GERD- symptoms are stable, continue carafate and protonix for now   [facility], MD  [facility] [phone] (Monday-Friday 8 am - 5 pm) [phone] (afterhours)

## 2013-04-27 ENCOUNTER — Non-Acute Institutional Stay (SKILLED_NURSING_FACILITY): Payer: PRIVATE HEALTH INSURANCE | Admitting: Internal Medicine

## 2013-04-27 DIAGNOSIS — D649 Anemia, unspecified: Secondary | ICD-10-CM

## 2013-04-27 DIAGNOSIS — E559 Vitamin D deficiency, unspecified: Secondary | ICD-10-CM

## 2013-04-27 DIAGNOSIS — I119 Hypertensive heart disease without heart failure: Secondary | ICD-10-CM

## 2013-05-19 NOTE — Progress Notes (Signed)
Patient ID: Tyler Beard, male   DOB: May 02, 1934, 79 y.o.   MRN: [id number]    [facility]  Chief complaint- RV  No Known Allergies  HPI 79 y/o male patient is here for long term care. he has CVA with left sided hemiplegia and aphasia. He is in no distress. He is alert and oriented and denies any complaints. No concerns from the staff. Weight remains stable.   Review of Systems   Constitutional: Negative for fever, chills, weight loss, malaise/fatigue and diaphoresis.   HENT: Negative for congestion, hearing loss and sore throat.    Eyes: Negative for blurred vision, double vision and discharge.   Respiratory: Negative for cough, sputum production, shortness of breath and wheezing.    Cardiovascular: Negative for chest pain, palpitations, orthopnea and leg swelling.   Gastrointestinal: Negative for heartburn, nausea, vomiting, abdominal pain, diarrhea and constipation.   Genitourinary: Negative for dysuria, urgency, frequency and flank pain.   Musculoskeletal: Negative for back pain, falls Skin: Negative for itching and rash.   Neurological: Negative for dizziness, tingling, focal weakness and headaches.   Psychiatric/Behavioral: Negative for depression and memory loss. The patient is not nervous/anxious.    Medication reviewed. See [facility]  Past medical hx reviewed  Past surgical hx reviewed  Physical exam BP 110/70  Pulse 74  Temp(Src) 97.8 F (36.6 C)  Resp 18   GENERAL: no acute distress, normal body habitus EYES: conjunctivae normal, sclerae normal, normal eye lids NECK: supple, trachea midline, no neck masses, no thyroid tenderness, no thyromegaly, no lymphadenopathy RESPIRATORY: breathing stable, CTAB CARDIAC: RRR, no murmur,no extra heart sounds, no edema GI: abdomen soft, normal BS, no masses, no tenderness, no hepatomegaly, no splenomegaly EXTREMITIES: left sided weakness, on wheelchair NEURO: no new focal deficit, left sided hemiparesis, has  slurred speech PSYCHIATRIC: alert and oriented to person  Labs   01/26/13 t.chol 165, tg 134, ldl 105, hdl 33, a1c 6, vit d 18.43 03/29/13 a1c 6.1, wbc 6.4, hb 13.5, hct 43, plt 253, na 130, k 4.3, bun 5, cr 0.8, glu 93  Assessment/plan  Vitamin d def- continue vitamin d 2000 u 2 cap daily  Anemia- hemoglobin count stable, off iron., continue protonix and monitor h/h  Being hypertensive heart disease No signs of CHF. Heart rate and bp controled. Monitor clinically

## 2013-05-20 DIAGNOSIS — D649 Anemia, unspecified: Secondary | ICD-10-CM | POA: Insufficient documentation

## 2013-05-20 DIAGNOSIS — I119 Hypertensive heart disease without heart failure: Secondary | ICD-10-CM | POA: Insufficient documentation

## 2013-06-14 ENCOUNTER — Non-Acute Institutional Stay (SKILLED_NURSING_FACILITY): Payer: PRIVATE HEALTH INSURANCE | Admitting: Internal Medicine

## 2013-06-14 DIAGNOSIS — I69959 Hemiplegia and hemiparesis following unspecified cerebrovascular disease affecting unspecified side: Secondary | ICD-10-CM

## 2013-06-14 DIAGNOSIS — I739 Peripheral vascular disease, unspecified: Secondary | ICD-10-CM

## 2013-06-14 DIAGNOSIS — E782 Mixed hyperlipidemia: Secondary | ICD-10-CM

## 2013-06-18 ENCOUNTER — Encounter: Payer: Self-pay | Admitting: Gastroenterology

## 2013-06-18 ENCOUNTER — Other Ambulatory Visit (INDEPENDENT_AMBULATORY_CARE_PROVIDER_SITE_OTHER): Payer: PRIVATE HEALTH INSURANCE

## 2013-06-18 ENCOUNTER — Ambulatory Visit (INDEPENDENT_AMBULATORY_CARE_PROVIDER_SITE_OTHER): Payer: PRIVATE HEALTH INSURANCE | Admitting: Gastroenterology

## 2013-06-18 VITALS — BP 122/80 | HR 72 | Ht 66.0 in | Wt 187.0 lb

## 2013-06-18 DIAGNOSIS — D132 Benign neoplasm of duodenum: Secondary | ICD-10-CM

## 2013-06-18 DIAGNOSIS — D1330 Benign neoplasm of unspecified part of small intestine: Secondary | ICD-10-CM

## 2013-06-18 LAB — CBC WITH DIFFERENTIAL/PLATELET
BASOS ABS: 0 10*3/uL (ref 0.0–0.1)
Basophils Relative: 0.4 % (ref 0.0–3.0)
EOS ABS: 0.3 10*3/uL (ref 0.0–0.7)
Eosinophils Relative: 4.4 % (ref 0.0–5.0)
HCT: 43.4 % (ref 39.0–52.0)
Hemoglobin: 14.1 g/dL (ref 13.0–17.0)
LYMPHS PCT: 36.9 % (ref 12.0–46.0)
Lymphs Abs: 2.2 10*3/uL (ref 0.7–4.0)
MCHC: 32.6 g/dL (ref 30.0–36.0)
MCV: 87.4 fl (ref 78.0–100.0)
Monocytes Absolute: 0.4 10*3/uL (ref 0.1–1.0)
Monocytes Relative: 7.4 % (ref 3.0–12.0)
NEUTROS PCT: 50.9 % (ref 43.0–77.0)
Neutro Abs: 3 10*3/uL (ref 1.4–7.7)
Platelets: 266 10*3/uL (ref 150.0–400.0)
RBC: 4.97 Mil/uL (ref 4.22–5.81)
RDW: 14.3 % (ref 11.5–14.6)
WBC: 5.9 10*3/uL (ref 4.5–10.5)

## 2013-06-18 LAB — COMPREHENSIVE METABOLIC PANEL
ALBUMIN: 3.7 g/dL (ref 3.5–5.2)
ALK PHOS: 99 U/L (ref 39–117)
ALT: 9 U/L (ref 0–53)
AST: 15 U/L (ref 0–37)
BUN: 8 mg/dL (ref 6–23)
CHLORIDE: 101 meq/L (ref 96–112)
CO2: 32 mEq/L (ref 19–32)
CREATININE: 0.9 mg/dL (ref 0.4–1.5)
Calcium: 9.5 mg/dL (ref 8.4–10.5)
GFR: 109.43 mL/min (ref >60.00)
Glucose, Bld: 107 mg/dL — ABNORMAL HIGH (ref 70–99)
Potassium: 4.3 mEq/L (ref 3.5–5.1)
Sodium: 139 mEq/L (ref 135–145)
Total Bilirubin: 0.4 mg/dL (ref 0.3–1.2)
Total Protein: 6.9 g/dL (ref 6.0–8.3)

## 2013-06-18 NOTE — Patient Instructions (Signed)
Go to the basement for labs Follow up in one year

## 2013-06-18 NOTE — Assessment & Plan Note (Signed)
Patient has a very slowly enlarging duodenal villous adenoma for which he is asymptomatic.  Resection would require a Whipple procedure and patient is not felt to be a candidate for this.  He is hemiplegic and is a nursing home resident.  Recommendations #1 check CBC and LFTs #2 reserve further therapy unless he develops symptoms such as a gastric outlet obstruction at which point I would consider duodenal stenting

## 2013-06-18 NOTE — Progress Notes (Signed)
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  He has no GI complaints including nausea, vomiting, abdominal pain or change in bowel habits.  He denies melena or hematochezia.    Past Medical History  Diagnosis Date  . Hypertension   . Depression   . GERD (gastroesophageal reflux disease)   . Hypercholesterolemia   . Glaucoma   . Cataracts, bilateral   . Cerebrovascular accident 2007   Past Surgical History  Procedure Laterality Date  . Section of villous adenoma of colon    . Cataract surgery      bilateral  . Aphagia    . Esophagogastroduodenoscopy  01/10/2012    Procedure: ESOPHAGOGASTRODUODENOSCOPY (EGD);  Surgeon: Inda Castle, MD;  Location: [facility];  Service: Endoscopy;  Laterality: N/A;   family history includes Cancer in his father, maternal aunt, mother, and sister. Current Outpatient Prescriptions  Medication Sig Dispense Refill  . atorvastatin (LIPITOR) 10 MG tablet Take 10 mg by mouth daily.      . cholecalciferol (VITAMIN D) 1000 UNITS tablet Take 4,000 Units by mouth daily.      Marland Kitchen lactulose (CHRONULAC) 10 GM/15ML solution Take 10 g by mouth 2 (two) times daily. 15 ml bid      . lidocaine (LIDODERM) 5 % Place 1 patch onto the skin daily. Remove & Discard patch within 12 hours or as directed by MD      . lubiprostone (AMITIZA) 24 MCG capsule Take 24 mcg by mouth 2 (two) times daily with a meal.      . pantoprazole (PROTONIX) 40 MG tablet Take 40 mg by mouth daily.      . sennosides-docusate sodium (SENOKOT-S) 8.6-50 MG tablet Take 1 tablet by mouth 2 (two) times daily.      . sertraline (ZOLOFT) 25 MG tablet Take 12.5 mg by mouth daily.       . sucralfate (CARAFATE) 1 G tablet Take 1 g by mouth 4 (four) times  daily -  with meals and at bedtime.      . vitamin B-12 (CYANOCOBALAMIN) 1000 MCG tablet Take 1,000 mcg by mouth daily.       No current facility-administered medications for this visit.   Allergies as of 06/18/2013  . (No Known Allergies)    reports that he has never smoked. He has never used smokeless tobacco. He reports that he does not drink alcohol or use illicit drugs.     Review of Systems: Pertinent positive and negative review of systems were noted in the above HPI section. All other review of systems were otherwise negative.  Vital signs were reviewed in today's medical record Physical Exam: General: Well developed , well nourished, no acute distress Skin: anicteric Head: Normocephalic and atraumatic Eyes:  sclerae anicteric, EOMI Ears: Normal auditory acuity Mouth: No deformity or lesions Neck: Supple, no masses or thyromegaly Lungs: Clear throughout to auscultation Heart: Regular rate and rhythm; no murmurs, rubs or bruits Abdomen: Soft, non tender and non distended. No masses, hepatosplenomegaly or hernias noted. Normal Bowel sounds Rectal:deferred Musculoskeletal: Symmetrical with no gross deformities  Skin: No lesions on visible extremities Pulses:  Normal pulses noted Extremities: No clubbing, cyanosis, edema or deformities noted Neurological: Alert oriented x 4.  He has a left hemiplegia Cervical Nodes:  No significant cervical adenopathy Inguinal Nodes: No significant inguinal adenopathy Psychological:  Alert and cooperative. Normal mood and affect  See Assessment and Plan under Problem List

## 2013-06-25 LAB — HEMOGLOBIN A1C: HEMOGLOBIN A1C: 6.2 % — AB (ref 4.0–6.0)

## 2013-06-25 LAB — CBC AND DIFFERENTIAL
HCT: 42 % (ref 41–53)
Hemoglobin: 13 g/dL — AB (ref 13.5–17.5)
PLATELETS: 235 10*3/uL (ref 150–399)
WBC: 6.4 10^3/mL

## 2013-06-25 LAB — BASIC METABOLIC PANEL
BUN: 9 mg/dL (ref 4–21)
Glucose: 90 mg/dL
POTASSIUM: 4.2 mmol/L (ref 3.4–5.3)
SODIUM: 139 mmol/L (ref 137–147)

## 2013-07-02 ENCOUNTER — Non-Acute Institutional Stay (SKILLED_NURSING_FACILITY): Payer: PRIVATE HEALTH INSURANCE | Admitting: Internal Medicine

## 2013-07-02 DIAGNOSIS — D375 Neoplasm of uncertain behavior of rectum: Secondary | ICD-10-CM

## 2013-07-02 DIAGNOSIS — K219 Gastro-esophageal reflux disease without esophagitis: Secondary | ICD-10-CM

## 2013-07-02 DIAGNOSIS — D378 Neoplasm of uncertain behavior of other specified digestive organs: Secondary | ICD-10-CM

## 2013-07-02 DIAGNOSIS — D372 Neoplasm of uncertain behavior of small intestine: Secondary | ICD-10-CM

## 2013-07-02 DIAGNOSIS — D371 Neoplasm of uncertain behavior of stomach: Secondary | ICD-10-CM

## 2013-07-02 DIAGNOSIS — D649 Anemia, unspecified: Secondary | ICD-10-CM

## 2013-07-30 ENCOUNTER — Non-Acute Institutional Stay (SKILLED_NURSING_FACILITY): Payer: PRIVATE HEALTH INSURANCE | Admitting: Internal Medicine

## 2013-07-30 DIAGNOSIS — I69959 Hemiplegia and hemiparesis following unspecified cerebrovascular disease affecting unspecified side: Secondary | ICD-10-CM

## 2013-07-30 DIAGNOSIS — I69354 Hemiplegia and hemiparesis following cerebral infarction affecting left non-dominant side: Secondary | ICD-10-CM

## 2013-07-30 DIAGNOSIS — F32A Depression, unspecified: Secondary | ICD-10-CM

## 2013-07-30 DIAGNOSIS — F3289 Other specified depressive episodes: Secondary | ICD-10-CM

## 2013-07-30 DIAGNOSIS — K219 Gastro-esophageal reflux disease without esophagitis: Secondary | ICD-10-CM

## 2013-07-30 DIAGNOSIS — F329 Major depressive disorder, single episode, unspecified: Secondary | ICD-10-CM

## 2013-07-30 NOTE — Progress Notes (Signed)
Patient ID: Tyler Beard, male   DOB: Sep 30, 1934, 79 y.o.   MRN: [id number]   [place] Complaint  Patient presents with  . Medical Management of Chronic Issues    depression, cva with left sided hemiparesis, GERD, HLD   No Known Allergies  Code Status: Full Code  HPI: 79 y/o male patient is here for long term care. he has CVA with left sided hemiplegia and aphasia. He is in no distress. He is alert and oriented and denies any complaints. No fall reported. No concerns from the staff. Weight remains stable.   Review of Systems   Constitutional: Negative for fever, chills, weight loss, malaise/fatigue and diaphoresis.   HENT: Negative for congestion, hearing loss and sore throat.    Eyes: Negative for blurred vision, double vision and discharge.   Respiratory: Negative for cough, shortness of breath, and wheezing.    Cardiovascular: Negative for chest pain, palpitations, orthopnea, and leg swelling.   Gastrointestinal: Negative for heartburn, nausea, vomiting, abdominal pain, diarrhea and constipation.   Genitourinary: Negative for dysuria, urgency, frequency and flank pain.   Musculoskeletal: Negative for back pain, falls Skin: Negative for itching and rash.   Neurological: Negative for dizziness, tingling, focal weakness and headaches.   Psychiatric/Behavioral: Negative for depression and memory loss. The patient is not nervous/anxious.    Past Medical History  Diagnosis Date  . Hypertension   . Depression   . GERD (gastroesophageal reflux disease)   . Hypercholesterolemia   . Glaucoma   . Cataracts, bilateral   . Cerebrovascular accident 2007   Past Surgical History  Procedure Laterality Date  . Section of villous adenoma of colon    . Cataract surgery      bilateral  . Aphagia    . Esophagogastroduodenoscopy  01/10/2012    Procedure: ESOPHAGOGASTRODUODENOSCOPY (EGD);  Surgeon: Inda Castle, MD;  Location: [facility];  Service:  Endoscopy;  Laterality: N/A;   Outpatient Encounter Prescriptions as of 07/30/2013  Medication Sig  . atorvastatin (LIPITOR) 10 MG tablet Take 10 mg by mouth daily.  . cholecalciferol (VITAMIN D) 1000 UNITS tablet Take 4,000 Units by mouth daily.  Marland Kitchen lactulose (CHRONULAC) 10 GM/15ML solution Take 10 g by mouth 2 (two) times daily. 15 ml bid  . lubiprostone (AMITIZA) 24 MCG capsule Take 24 mcg by mouth 2 (two) times daily with a meal.  . pantoprazole (PROTONIX) 40 MG tablet Take 40 mg by mouth daily.  . sennosides-docusate sodium (SENOKOT-S) 8.6-50 MG tablet Take 1 tablet by mouth 2 (two) times daily.  . sertraline (ZOLOFT) 25 MG tablet Take 12.5 mg by mouth daily.   . sucralfate (CARAFATE) 1 G tablet Take 1 g by mouth 4 (four) times daily -  with meals and at bedtime.  . vitamin B-12 (CYANOCOBALAMIN) 1000 MCG tablet Take 1,000 mcg by mouth daily.  . [DISCONTINUED] lidocaine (LIDODERM) 5 % Place 1 patch onto the skin daily. Remove & Discard patch within 12 hours or as directed by MD   Physical Exam:  BP 132/76  Pulse 75  Temp(Src) 98.8 F (37.1 C)  Resp 20  Ht 5\' 8"  (1.727 m)  Wt 185 lb 12.8 oz (84.278 kg)  BMI 28.26 kg/m2  SpO2 97%  GENERAL: well developed, well-nourished elderly male. He is not in acute distress, normal body habitus HEAD: normacephalic, nontraumatic  EYES: conjunctivae normal, EOM intact, PERRL NECK: supple, no JVD, no carotid bruit RESPIRATORY: unlabored respiratory effort. CTA bilaterally. No wheezing, crackles,  rhonchi CARDIAC: RRR, no murmur,no extra heart sounds, no edema GI: abdomen soft, bowel sound presents. no tenderness EXTREMITIES: left sided weakness, in bed NEURO: no new focal deficit, left sided hemiparesis, has slurred speech PSYCHIATRIC: alert and oriented to person  Labs Reviewed:   01/26/13 t.chol 165, tg 134, ldl 105, hdl 33, a1c 6, vit d 18.43 03/29/13 a1c 6.1, wbc 6.4, hb 13.5, hct 43, plt 253, na 130, k 4.3, bun 5, cr 0.8, glu 93 06/04/13  total chol 100, ldl 53, trig 84 06/26/13: a1c 6.2, Na 139, k 4.2, cl 101, gluc 90, bun 9, cr 0.9, wbc 6.4, hb 13, hct 41.5, mcv 88.7, plt 235  Assessment/Plan:   1. Hemiparesis affecting left side as late effect of cerebrovascular accident No new neurologic symptoms. Left side hemiparesis present. BP stable without med. Lipids are controlled with statin. Continue rehab and support of ADLs.  2. Depression Stable. Continue zoloft 12.5mg  PO QD. Monitor mood.   3. GERD (gastroesophageal reflux disease) Stable with protonix 40mg  po qd and sucrafate 1g po qid. Continue current therapy.   Plan of care discuss with nursing staff. Nursing staff verbalize understanding and agree with plan of care

## 2013-09-02 DIAGNOSIS — I739 Peripheral vascular disease, unspecified: Secondary | ICD-10-CM | POA: Insufficient documentation

## 2013-09-02 DIAGNOSIS — I69959 Hemiplegia and hemiparesis following unspecified cerebrovascular disease affecting unspecified side: Secondary | ICD-10-CM | POA: Insufficient documentation

## 2013-09-02 DIAGNOSIS — E782 Mixed hyperlipidemia: Secondary | ICD-10-CM | POA: Insufficient documentation

## 2013-09-02 NOTE — Progress Notes (Signed)
Patient ID: Tyler Beard, male   DOB: 09-06-1934, 79 y.o.   MRN: [id number]    Facility: [facility]  Chief Complaint  Patient presents with  . Medical Management of Chronic Issues    rv   No Known Allergies  HPI 79 y/o male patient is here for long term care. he has CVA with left sided hemiplegia and also has aphasia. He has GERD and hyperlipidemia and is at high aspiration risk. He is in no distress. He is alert and oriented and denies any complaints. No concerns from the staff.   Review of Systems   Constitutional: Negative for fever, chills, malaise/fatigue and diaphoresis.   HENT: Negative for congestion, hearing loss and sore throat.    Eyes: Negative for blurred vision, double vision and discharge.   Respiratory: Negative for cough, sputum production, shortness of breath and wheezing.    Cardiovascular: Negative for chest pain, palpitations, orthopnea and leg swelling.   Gastrointestinal: Negative for heartburn, nausea, vomiting, abdominal pain, diarrhea and constipation.   Genitourinary: Negative for dysuria, urgency, frequency and flank pain.   Musculoskeletal: Negative for back pain, falls, joint pain and myalgias.   Skin: Negative for itching and rash.   Neurological: Negative for dizziness, tingling, focal weakness and headaches.   Psychiatric/Behavioral: Negative for depression and memory loss. The patient is not nervous/anxious.    Medication reviewed. See [facility]  Past medical hx reviewed  Physical exam BP 124/80  Pulse 70  Temp(Src) 98.3 F (36.8 C)  Resp 18  SpO2 97%  GENERAL: no acute distress, normal body habitus EYES: conjunctivae normal, sclerae normal, normal eye lids NECK: supple, trachea midline, no neck masses, no thyroid tenderness, no thyromegaly, no lymphadenopathy RESPIRATORY: breathing stable, CTAB CARDIAC: RRR, no murmur,no extra heart sounds, no edema, poor distal pulses GI: abdomen soft, normal BS, no masses,  no tenderness, no hepatomegaly, no splenomegaly EXTREMITIES: left sided weakness, on wheelchair NEURO: no new focal deficit, left sided hemiparesis, has slurred speech PSYCHIATRIC: alert and oriented to person  Labs   01/26/13 t.chol 165, tg 134, ldl 105, hdl 33, a1c 6, vit d 18.43 03/29/13 b12 300, wbc 6.4, hb 13.5, hct 43, mcv 87.8, plt 253, na 138, k 4.3, bun 5, cr 0.8, glu 93, ca 9, alp 98, alt 7, ast 11, t.bil 0.7, alb 3.6, tsh 2.41  Assessment/plan  cva with hemiplegia Continue working with restorative team. Monitor bowel movement and continue miralax. On lipitor 10 mg daily. bp stable. Fall precautions  Hyperlipidemia Continue lipitor 10 mg daily  PVD Stable, continue statin

## 2013-09-13 ENCOUNTER — Non-Acute Institutional Stay (SKILLED_NURSING_FACILITY): Payer: PRIVATE HEALTH INSURANCE | Admitting: Internal Medicine

## 2013-09-13 DIAGNOSIS — F339 Major depressive disorder, recurrent, unspecified: Secondary | ICD-10-CM

## 2013-09-13 DIAGNOSIS — D372 Neoplasm of uncertain behavior of small intestine: Secondary | ICD-10-CM | POA: Insufficient documentation

## 2013-09-13 DIAGNOSIS — E559 Vitamin D deficiency, unspecified: Secondary | ICD-10-CM

## 2013-09-13 DIAGNOSIS — E782 Mixed hyperlipidemia: Secondary | ICD-10-CM

## 2013-09-13 DIAGNOSIS — I69959 Hemiplegia and hemiparesis following unspecified cerebrovascular disease affecting unspecified side: Secondary | ICD-10-CM

## 2013-09-13 NOTE — Progress Notes (Signed)
Patient ID: Tyler Beard, male   DOB: 03-Aug-1934, 79 y.o.   MRN: [id number]    Facility: [facility]  Cc- RV  No Known Allergies  HPI 79 y/o male patient is here for long term care. No concerns from the staff. He has been at his baseline  Review of Systems   Denies chest pain, dyspnea, cough, reflux, constipation and dysuria Weight stable no falls reported No skin concern Stable anemia  Medication reviewed. See [facility]  Past Medical History  Diagnosis Date  . Hypertension   . Depression   . GERD (gastroesophageal reflux disease)   . Hypercholesterolemia   . Glaucoma   . Cataracts, bilateral   . Cerebrovascular accident 2007    Physical exam Vss, afebrile  General- elderly male in no acute distress Neck- no lymphadenopathy, no thyromegaly, no jugular vein distension Cardiovascular- normal s1,s2, no murmurs, normal distal pulses, no leg edema Respiratory- bilateral clear to auscultation, no wheeze, no rhonchi, no crackles Abdomen- bowel sounds present, soft, non tender, no CVA tenderness, scar of old peg tube Musculoskeletal- left sided weakness with hemiparesis, LUE contracture at elbow and wrist joint, on wheelchair, RUE and RLE adequate strength, can self propel on [facility] Psychiatry- alert and oriented to person  Labs   01/26/13 t.chol 165, tg 134, ldl 105, hdl 33, a1c 6, vit d 18.43 03/29/13 tsh 2.414 06/04/13 t.chol 100, tg 84, ldl 53, hdl 30  Assessment/plan  GERD- symptoms are stable, continue carafate and protonix for now. 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 Cbc s/o stable h&h. Continue PPI. No signs of bowel obstruction at present

## 2013-09-13 NOTE — Progress Notes (Signed)
Patient ID: Tyler Beard, male   DOB: 01/14/1935, 79 y.o.   MRN: [id number]    Facility: [facility]  Chief Complaint  Patient presents with  . Medical Management of Chronic Issues   No Known Allergies  HPI 79 y/o male patient is here for long term care. he has CVA with left sided hemiplegia and aphasia. He has been at his baseline. He is in no distress. He is alert and oriented and denies any complaints. No concerns from the staff  Review of Systems   Constitutional: Negative for fever, chills, weight loss, malaise/fatigue and diaphoresis.   HENT: Negative for congestion, hearing loss and sore throat.    Eyes: Negative for blurred vision, double vision and discharge.   Respiratory: Negative for cough, sputum production, shortness of breath and wheezing.    Cardiovascular: Negative for chest pain, palpitations, orthopnea and leg swelling.   Gastrointestinal: Negative for heartburn, nausea, vomiting, abdominal pain, diarrhea and constipation.   Genitourinary: Negative for dysuria, urgency, frequency and flank pain.   Musculoskeletal: Negative for back pain, falls, joint pain and myalgias. Self propels on WC Skin: Negative for itching and rash.   Neurological: Negative for dizziness, tingling, focal weakness and headaches.   Psychiatric/Behavioral: Negative for depression and memory loss. The patient is not nervous/anxious.    Medication reviewed. See [facility]  Past medical hx reviewed  Physical exam BP 126/76  Pulse 69  Temp(Src) 97.2 F (36.2 C)  Resp 18  SpO2 96%  General- elderly male in no acute distress Head- atraumatic, normocephalic Eyes- PERRLA, EOMI, no pallor, no icterus, no discharge Neck- no lymphadenopathy, no thyromegaly, no jugular vein distension Cardiovascular- normal s1,s2, no murmurs, normal distal pulses, no leg edema Respiratory- bilateral clear to auscultation, no wheeze, no rhonchi, no crackles Abdomen- bowel sounds  present, soft, non tender, no CVA tenderness, scar of old peg tube Musculoskeletal- left sided weakness with hemiparesis, LUE contracture at elbow and wrist joint, on wheelchair, RUE and RLE adequate strength, can self propel on WC Neuro- no new focal deficit, left sided hemiparesis, has slurred speech Psychiatry- alert and oriented to person  Labs   01/26/13 t.chol 165, tg 134, ldl 105, hdl 33, a1c 6, vit d 18.43 06/25/13 wbc 6.4, hb 13, hct 41.5, plt 235, na 139, k 4.2, cl 101, co2 33, bun 9, cr 0.9, glu 90, ca 9.1, a1c 6.2  Assessment/plan  Depression- stable at present. Continue zoloft 25 mg daily  Vitamin d def- continue vitamin d 2000 u 2 cap daily  CVA- stable with left sided hemiparesis present. On lidoderm patch and prn tylenol for pain. Off all bp meds. Continue lipitor 10 mg daily. Continue bowel regimen. Skin care. And pressure ulcer prophylaxis  hyperlipidemia- stable. Continue lipitor

## 2013-10-08 ENCOUNTER — Non-Acute Institutional Stay (SKILLED_NURSING_FACILITY): Payer: PRIVATE HEALTH INSURANCE | Admitting: Internal Medicine

## 2013-10-08 ENCOUNTER — Encounter: Payer: Self-pay | Admitting: Internal Medicine

## 2013-10-08 DIAGNOSIS — D132 Benign neoplasm of duodenum: Secondary | ICD-10-CM

## 2013-10-08 DIAGNOSIS — D509 Iron deficiency anemia, unspecified: Secondary | ICD-10-CM

## 2013-10-08 DIAGNOSIS — K219 Gastro-esophageal reflux disease without esophagitis: Secondary | ICD-10-CM

## 2013-10-08 DIAGNOSIS — D1330 Benign neoplasm of unspecified part of small intestine: Secondary | ICD-10-CM

## 2013-10-08 NOTE — Progress Notes (Signed)
Patient ID: Tyler Beard, male   DOB: 1934-06-13, 79 y.o.   MRN: [id number]  Location:  [place]  Provider:  Blanchie Serve, MD  Code Status:  Full  Chief Complaint  Patient presents with  . Medical Management of Chronic Issues    Facility: [facility]  No Known Allergies  HPI 79 y/o male patient is here for long term care. he has CVA with left sided hemiplegia. No concerns from the staff. He denies any concerns. Has some aphasia  Review of Systems   Constitutional: Negative for fever, chills, weight loss, malaise/fatigue and diaphoresis.   Respiratory: Negative for cough, sputum production, shortness of breath and wheezing.    Cardiovascular: Negative for chest pain, palpitations, orthopnea and leg swelling.   Gastrointestinal: Negative for heartburn, nausea, vomiting, abdominal pain.   Genitourinary: Negative for dysuria.   Musculoskeletal: Negative for back pain, falls Skin: Negative for itching and rash.   Psychiatric/Behavioral: Negative for depression.    Medications: Patient's Medications  New Prescriptions   No medications on file  Previous Medications   ATORVASTATIN (LIPITOR) 10 MG TABLET    Take 10 mg by mouth daily.   CHOLECALCIFEROL (VITAMIN D) 1000 UNITS TABLET    Take 4,000 Units by mouth daily.   LACTULOSE (CHRONULAC) 10 GM/15ML SOLUTION    Take 10 g by mouth 2 (two) times daily. 15 ml bid   LUBIPROSTONE (AMITIZA) 24 MCG CAPSULE    Take 24 mcg by mouth 2 (two) times daily with a meal. For Ibs   PANTOPRAZOLE (PROTONIX) 40 MG TABLET    Take 40 mg by mouth daily. For GERD   POLYETHYLENE GLYCOL (MIRALAX / GLYCOLAX) PACKET    Take 17 g by mouth 2 (two) times daily. For constipation   SENNOSIDES-DOCUSATE SODIUM (SENOKOT-S) 8.6-50 MG TABLET    Take 1 tablet by mouth 2 (two) times daily. For constipation   SERTRALINE (ZOLOFT) 25 MG TABLET    Take 12.5 mg by mouth daily. For depression   SUCRALFATE (CARAFATE) 1 G TABLET    Take  1 g by mouth 4 (four) times daily -  with meals and at bedtime.   VITAMIN B-12 (CYANOCOBALAMIN) 1000 MCG TABLET    Take 1,000 mcg by mouth daily.  Modified Medications   No medications on file  Discontinued Medications   No medications on file    Physical Exam: Filed Vitals:   10/08/13 1255  BP: 133/76  Pulse: 84  Temp: 98 F (36.7 C)  Resp: 20  Height: 5' 9.6" (1.768 m)  Weight: 185 lb 12.8 oz (84.278 kg)   General- elderly male in no acute distress Head- atraumatic, normocephalic Eyes- PERRLA, EOMI, no pallor, no icterus, no discharge Neck- no lymphadenopathy, no thyromegaly, no jugular vein distension Cardiovascular- normal s1,s2, no murmurs, normal distal pulses, no leg edema Respiratory- bilateral clear to auscultation, no wheeze, no rhonchi, no crackles Abdomen- bowel sounds present, soft, non tender, no CVA tenderness, scar of old peg tube Musculoskeletal- left sided weakness with hemiparesis, LUE contracture at elbow and wrist joint, on wheelchair, RUE and RLE adequate strength, can self propel on WC Neuro- no new focal deficit, left sided hemiparesis, has slurred speech Psychiatry- alert and oriented to person    Labs reviewed: Basic Metabolic Panel:  Recent Labs  06/18/13 1002 06/25/13  NA 139 139  K 4.3 4.2  CL 101  --   CO2 32  --   GLUCOSE 107*  --  BUN 8 9  CREATININE 0.9  --   CALCIUM 9.5  --     Liver Function Tests:  Recent Labs  06/18/13 1002  AST 15  ALT 9  ALKPHOS 99  BILITOT 0.4  PROT 6.9  ALBUMIN 3.7    CBC:  Recent Labs  06/18/13 1002 06/25/13  WBC 5.9 6.4  NEUTROABS 3.0  --   HGB 14.1 13.0*  HCT 43.4 42  MCV 87.4  --   PLT 266.0 235    Assessment/Plan  Esophageal reflux Stable, continue sucralfate and protonix  Anemia With hx of hernia, reflux and duodenal villos adenoma, hb has been stable. Continue his ferrous sulfate supplement. Monitor h&h  Benign neoplasm of duodenum, jejunum and ileum Slow progression,  currently asymptomatic. Not a candidate for surgery. Monitor for symptom of GOO. Conservative management for now

## 2013-11-08 ENCOUNTER — Non-Acute Institutional Stay (SKILLED_NURSING_FACILITY): Payer: PRIVATE HEALTH INSURANCE | Admitting: Internal Medicine

## 2013-11-08 DIAGNOSIS — I119 Hypertensive heart disease without heart failure: Secondary | ICD-10-CM

## 2013-11-08 DIAGNOSIS — F339 Major depressive disorder, recurrent, unspecified: Secondary | ICD-10-CM

## 2013-11-08 DIAGNOSIS — K449 Diaphragmatic hernia without obstruction or gangrene: Secondary | ICD-10-CM

## 2013-11-08 DIAGNOSIS — D509 Iron deficiency anemia, unspecified: Secondary | ICD-10-CM | POA: Insufficient documentation

## 2013-11-08 DIAGNOSIS — H04129 Dry eye syndrome of unspecified lacrimal gland: Secondary | ICD-10-CM

## 2013-11-08 NOTE — Progress Notes (Signed)
Patient ID: Tyler Beard, male   DOB: [birth year], 78 y.o.   MRN: [id number]    Facility: [facility]  No Known Allergies  Chief complaint- medical management of chronic issues  HPI 79 y/o male patient is here for long term care. he has CVA with left sided hemiplegia. He has been at his baseline. On review, he has been re-started on zoloft at 25 mg daily recently and tolerating it well. Mood good today. No new concerns from him. He is watching television. He has some difficulty with word finding but can make his needs known. No concerns from the staff  Review of Systems   Constitutional: Negative for fever, chills, weight loss, malaise/fatigue and diaphoresis.   HENT: Negative for congestion, hearing loss and sore throat.    Eyes: Negative for blurred vision, double vision and discharge.   Respiratory: Negative for cough, sputum production, shortness of breath and wheezing.    Cardiovascular: Negative for chest pain, palpitations, orthopnea and leg swelling.   Gastrointestinal: Negative for heartburn, nausea, vomiting, abdominal pain.   Genitourinary: Negative for dysuria.   Musculoskeletal: Negative for back pain, falls Skin: Negative for itching and rash.   Neurological: Negative for dizziness and headaches.   Psychiatric/Behavioral: Negative for depression.    Medication reviewed. See [facility]  Past medical hx reviewed  Physical exam BP 129/72  Pulse 73  Temp(Src) 96.6 F (35.9 C)  Resp 18  SpO2 96%  General- elderly male in no acute distress Head- atraumatic, normocephalic Eyes- PERRLA, EOMI, no pallor, no icterus, no discharge Neck- no lymphadenopathy, no thyromegaly, no jugular vein distension Cardiovascular- normal s1,s2, no murmurs, normal distal pulses, no leg edema Respiratory- bilateral clear to auscultation, no wheeze, no rhonchi, no crackles Abdomen- bowel sounds present, soft, non tender, no CVA tenderness, scar of old peg  tube Musculoskeletal- left sided weakness with hemiparesis, LUE contracture at elbow and wrist joint, on wheelchair, RUE and RLE adequate strength, can self propel on WC Neuro- no new focal deficit, left sided hemiparesis, has slurred speech Psychiatry- alert and oriented to person  Labs   01/26/13 t.chol 165, tg 134, ldl 105, hdl 33, a1c 6, vit d 18.43 06/25/13 wbc 6.4, hb 13, hct 41.5, plt 235, na 139, k 4.2, cl 101, co2 33, bun 9, cr 0.9, glu 90, ca 9.1, a1c 6.2  Assessment/plan  Depression Mood currently stable continue zoloft 25 mg daily, tolerating well.  HTN Stable, off all bp meds, bp at goal. Monitor  Tear film insufficiency Continue artificial tears  hiatal hernia tolerating carfate and protonix for now

## 2013-11-26 LAB — LIPID PANEL
CHOLESTEROL: 110 mg/dL (ref 0–200)
HDL: 34 mg/dL — AB (ref 35–70)
LDL CALC: 61 mg/dL
TRIGLYCERIDES: 74 mg/dL (ref 40–160)

## 2013-11-26 LAB — CBC AND DIFFERENTIAL
HEMATOCRIT: 39 % — AB (ref 41–53)
Hemoglobin: 12.1 g/dL — AB (ref 13.5–17.5)
Platelets: 246 10*3/uL (ref 150–399)
WBC: 5.8 10^3/mL

## 2013-11-26 LAB — BASIC METABOLIC PANEL
BUN: 10 mg/dL (ref 4–21)
Creatinine: 0.9 mg/dL (ref 0.6–1.3)
Glucose: 93 mg/dL
Potassium: 4.1 mmol/L (ref 3.4–5.3)
SODIUM: 139 mmol/L (ref 137–147)

## 2013-11-26 LAB — HEPATIC FUNCTION PANEL
ALT: 9 U/L — AB (ref 10–40)
AST: 9 U/L — AB (ref 14–40)

## 2013-11-26 LAB — HEMOGLOBIN A1C: Hgb A1c MFr Bld: 6.1 % — AB (ref 4.0–6.0)

## 2013-12-10 ENCOUNTER — Non-Acute Institutional Stay (SKILLED_NURSING_FACILITY): Payer: PRIVATE HEALTH INSURANCE | Admitting: Internal Medicine

## 2013-12-10 ENCOUNTER — Encounter: Payer: Self-pay | Admitting: Internal Medicine

## 2013-12-10 DIAGNOSIS — K219 Gastro-esophageal reflux disease without esophagitis: Secondary | ICD-10-CM

## 2013-12-10 DIAGNOSIS — K5900 Constipation, unspecified: Secondary | ICD-10-CM

## 2013-12-10 DIAGNOSIS — E782 Mixed hyperlipidemia: Secondary | ICD-10-CM

## 2013-12-10 NOTE — Progress Notes (Signed)
Patient ID: Tyler Beard, male   DOB: 1934-12-30, 79 y.o.   MRN: [id number]   Place of Service: [facility]  No Known Allergies  Code Status: Full Code Goals of Care: Longevity/Long term care  Chief Complaint  Patient presents with  . Medical Management of Chronic Issues    GERD, HLD, Constipation    HPI 79 y.o. male with PMH of old CVA, HTN, GERD, HLD, and chronic constipation is being seen for a routine visit. No complaints verbalized. No falls or skin issues reported. Weight stable. No change in behavior or functional status. No new concerns from nursing staff.   Review of Systems Constitutional: Negative for fever, chills, and fatigue. HENT: Negative for facial swelling, ear pain, congestion, and sore throat Eyes: Negative for eye pain, eye discharge, and visual disturbance  Cardiovascular: Negative for chest pain, palpitations, and leg swelling Respiratory: Negative cough, shortness of breath, and wheezing.  Gastrointestinal: Negative for nausea and vomiting. Negative for abdominal pain, diarrhea and constipation.  Musculoskeletal: Negative for back pain, joint pain, and joint swelling  Neurological: Negative for dizziness, headache, and tremors. Positive for L sided weakness Skin: Negative for rash and wound.   Psychiatric: Negative for nervous/anxious, agitation, depression, and suicidal ideas.   Past Medical History  Diagnosis Date  . Hypertension   . Depression   . GERD (gastroesophageal reflux disease)   . Hypercholesterolemia   . Glaucoma   . Cataracts, bilateral   . Cerebrovascular accident 2007    Past Surgical History  Procedure Laterality Date  . Section of villous adenoma of colon    . Cataract surgery      bilateral  . Aphagia    . Esophagogastroduodenoscopy  01/10/2012    Procedure: ESOPHAGOGASTRODUODENOSCOPY (EGD);  Surgeon: Inda Castle, MD;  Location: [facility];  Service: Endoscopy;  Laterality: N/A;    History   Social  History  . Marital Status: Married    Spouse Name: N/A    Number of Children: N/A  . Years of Education: N/A   Occupational History  . Not on file.   Social History Main Topics  . Smoking status: Never Smoker   . Smokeless tobacco: Never Used  . Alcohol Use: No  . Drug Use: No  . Sexual Activity: Not on file   Other Topics Concern  . Not on file   Social History Narrative  . No narrative on file      Medication List       This list is accurate as of: 12/10/13 10:44 AM.  Always use your most recent med list.               acetaminophen 325 MG tablet  Commonly known as:  TYLENOL  Take 650 mg by mouth every 4 (four) hours as needed for mild pain, moderate pain, fever or headache.     atorvastatin 10 MG tablet  Commonly known as:  LIPITOR  Take 10 mg by mouth daily.     cholecalciferol 1000 UNITS tablet  Commonly known as:  VITAMIN D  Take 4,000 Units by mouth daily.     lactulose 10 GM/15ML solution  Commonly known as:  CHRONULAC  Take 10 g by mouth 2 (two) times daily. 15 ml bid     lubiprostone 24 MCG capsule  Commonly known as:  AMITIZA  Take 24 mcg by mouth 2 (two) times daily with a meal. For Ibs     pantoprazole 40 MG tablet  Commonly  known as:  PROTONIX  Take 40 mg by mouth daily. For GERD     polyethylene glycol packet  Commonly known as:  MIRALAX / GLYCOLAX  Take 17 g by mouth 2 (two) times daily. For constipation     sennosides-docusate sodium 8.6-50 MG tablet  Commonly known as:  SENOKOT-S  Take 1 tablet by mouth 2 (two) times daily. For constipation     sertraline 25 MG tablet  Commonly known as:  ZOLOFT  Take 25 mg by mouth daily. For depression     sucralfate 1 G tablet  Commonly known as:  CARAFATE  Take 1 g by mouth 4 (four) times daily -  with meals and at bedtime.     vitamin B-12 1000 MCG tablet  Commonly known as:  CYANOCOBALAMIN  Take 1,000 mcg by mouth daily.        Physical Exam Filed Vitals:   12/10/13 1039  BP:  138/55  Pulse: 54  Temp: 97.7 F (36.5 C)  Resp: 18   Constitutional: WDWN AA elderly male in no acute distress.  HEENT: Normocephalic and atraumatic. PERRL. EOM intact. No icterus. No nasal discharge or sinus tenderness. Oral mucosa moist. Posterior pharynx clear of any exudate or lesions. Wears upper and lower dentures. Neck: Supple and nontender. No lymphadenopathy, masses, or thyromegaly. No JVD or carotid bruits. Cardiac: Normal S1, S2. RRR without appreciable murmurs, rubs, or gallops. Distal pulses intact. No dependent edema.  Lungs: No respiratory distress. Breath sounds clear bilaterally without rales, rhonchi, or wheezes. Abdomen: Audible bowel sounds in all quadrants. Soft, nontender, nondistended. No palpable mass.  Musculoskeletal:  No joint erythema or tenderness. Able to move RUE and RLE. Left hemiparesis with LUE contracture.  Skin: Warm and dry. No rash noted. No erythema.  Neurological: Alert and oriented to person and time.  Psychiatric: Judgment and insight adequate. Appropriate mood and affect.   Labs Reviewed CBC Latest Ref Rng 11/26/2013 06/25/2013 06/18/2013  WBC - 5.8 6.4 5.9  Hemoglobin 13.5 - 17.5 g/dL 12.1(A) 13.0(A) 14.1  Hematocrit 41 - 53 % 39(A) 42 43.4  Platelets 150 - 399 K/L 246 235 266.0    Lab Results  Component Value Date   HGBA1C 6.1* 11/26/2013     Chemistry      Component Value Date/Time   NA 139 11/26/2013   NA 139 06/18/2013 1002   K 4.1 11/26/2013   CL 101 06/18/2013 1002   CO2 32 06/18/2013 1002   BUN 10 11/26/2013   BUN 8 06/18/2013 1002   CREATININE 0.9 11/26/2013   CREATININE 0.9 06/18/2013 1002   GLU 93 11/26/2013      Component Value Date/Time   CALCIUM 9.5 06/18/2013 1002   ALKPHOS 99 06/18/2013 1002   AST 9* 11/26/2013   ALT 9* 11/26/2013   BILITOT 0.4 06/18/2013 1002     Lipid Panel     Component Value Date/Time   CHOL 110 11/26/2013   TRIG 74 11/26/2013   HDL 34* 11/26/2013   LDLCALC 61 11/26/2013   Assessment & Plan 1.  Gastroesophageal reflux disease, esophagitis presence not specified Stable. Continue protonix 40mg  daily and carafate 1g four times daily. Continue to monitor  2. Constipation, unspecified constipation type Persists. D/c amitiza and start linzess 290 mcg daily. D/ c senokot and miralax. Continue lactulose 10g twice daily. monitor  3. Mixed hyperlipidemia Stable. Continue lipitor 10mg  daily and monitor.   Family/Staff Communication Plan of care discuss with resident and professional staff members. Resident and professional  staff members verbalize understanding and agree with plan of care. No additional questions or concerns reported.    Arthur Holms, MSN, AGNP-C [address] [place], [place] [address] [phone] [8am-5pm] After hours: [phone]    I have personally reviewed this note and agree with the care plan  [facility], MD  [facility] [phone] (Monday-Friday 8 am - 5 pm) [phone] (afterhours)

## 2014-01-10 ENCOUNTER — Non-Acute Institutional Stay (SKILLED_NURSING_FACILITY): Payer: PRIVATE HEALTH INSURANCE | Admitting: Internal Medicine

## 2014-01-10 DIAGNOSIS — K219 Gastro-esophageal reflux disease without esophagitis: Secondary | ICD-10-CM

## 2014-01-10 DIAGNOSIS — K589 Irritable bowel syndrome without diarrhea: Secondary | ICD-10-CM

## 2014-01-10 DIAGNOSIS — D509 Iron deficiency anemia, unspecified: Secondary | ICD-10-CM

## 2014-01-10 DIAGNOSIS — K581 Irritable bowel syndrome with constipation: Secondary | ICD-10-CM

## 2014-01-10 NOTE — Progress Notes (Signed)
Patient ID: Tyler Beard, male   DOB: July 28, 1934, 79 y.o.   MRN: [id number]    Facility: [facility]  No Known Allergies  Chief complaint- medical management of chronic issues  HPI 79 y/o male patient is seen for routine visit. he has CVA with left sided hemiplegia. He has been at his baseline. No new concerns from him.   Review of Systems   Constitutional: Negative for fever, chills, weight loss, malaise/fatigue and diaphoresis.   HENT: Negative for congestion, hearing loss and sore throat.    Eyes: Negative for blurred vision, double vision and discharge.   Respiratory: Negative for cough, sputum production, shortness of breath and wheezing.    Cardiovascular: Negative for chest pain, palpitations, orthopnea and leg swelling.   Gastrointestinal: Negative for heartburn, nausea, vomiting, abdominal pain.   Genitourinary: Negative for dysuria.   Musculoskeletal: Negative for back pain, falls Skin: Negative for itching and rash.   Neurological: Negative for dizziness and headaches.   Psychiatric/Behavioral: Negative for depression.    Medication reviewed. See [facility]  Past Medical History  Diagnosis Date  . Hypertension   . Depression   . GERD (gastroesophageal reflux disease)   . Hypercholesterolemia   . Glaucoma   . Cataracts, bilateral   . Cerebrovascular accident 2007    Physical exam BP 133/76 mmHg  Pulse 74  Temp(Src) 97.5 F (36.4 C)  Resp 18  SpO2 97%  General- elderly male in no acute distress Head- atraumatic, normocephalic Eyes- PERRLA, EOMI, no pallor, no icterus, no discharge Neck- no lymphadenopathy, no thyromegaly, no jugular vein distension Cardiovascular- normal s1,s2, no murmurs, normal distal pulses, no leg edema Respiratory- bilateral clear to auscultation, no wheeze, no rhonchi, no crackles Abdomen- bowel sounds present, soft, non tender, no CVA tenderness, scar of old peg tube Musculoskeletal- left sided  weakness with hemiparesis, LUE contracture at elbow and wrist joint, on wheelchair, RUE and RLE adequate strength, can self propel on WC, brace on left hand Neuro- no new focal deficit, left sided hemiparesis, has slurred speech Psychiatry- alert and oriented to person  Labs   01/26/13 t.chol 165, tg 134, ldl 105, hdl 33, a1c 6, vit d 18.43 06/25/13 wbc 6.4, hb 13, hct 41.5, plt 235, na 139, k 4.2, cl 101, co2 33, bun 9, cr 0.9, glu 90, ca 9.1, a1c 6.2 11/23/13 a1c 6.1, t.chol 110, ldl 61, hdl 34, tg 74, na 139, k 4.1, bun 10, cr 0.9, glu 93, ca 9, wbc 5.8, hb 12.1, hct 39.3, plt 246  Assessment/plan  gerd Symptom stable on protonix and sucralfate, monitor clinically  Iron def anemia Off iron supplement with stable hb on 11/23/13. Monitor h&h  Constipation Continue linzess and senna s , monitor clinically

## 2014-02-14 ENCOUNTER — Non-Acute Institutional Stay (SKILLED_NURSING_FACILITY): Payer: PRIVATE HEALTH INSURANCE | Admitting: Internal Medicine

## 2014-02-14 DIAGNOSIS — F339 Major depressive disorder, recurrent, unspecified: Secondary | ICD-10-CM | POA: Insufficient documentation

## 2014-02-14 DIAGNOSIS — I119 Hypertensive heart disease without heart failure: Secondary | ICD-10-CM | POA: Insufficient documentation

## 2014-02-14 DIAGNOSIS — H04123 Dry eye syndrome of bilateral lacrimal glands: Secondary | ICD-10-CM

## 2014-02-14 DIAGNOSIS — K589 Irritable bowel syndrome without diarrhea: Secondary | ICD-10-CM | POA: Insufficient documentation

## 2014-02-14 NOTE — Progress Notes (Signed)
Patient ID: Tyler Beard, male   DOB: 06/29/1934, 79 y.o.   MRN: [id number]    Facility: [facility]  No Known Allergies  Chief complaint- medical management of chronic issues  HPI 79 y/o male patient is seen for routine visit. He has been at his baseline without any concerns. No concerns from the staff. Tolerating zoloft well, his mood has been good. He is working with restorative therapy. Bowel movement is stable.   Review of Systems   Constitutional: Negative for fever, chills, diaphoresis.   HENT: Negative for congestion   Eyes: Negative for blurred vision. Has dry eyes and uses artifical tears   Respiratory: Negative for cough, shortness of breath and wheezing.    Cardiovascular: Negative for chest pain, palpitations, leg swelling.   Gastrointestinal: Negative for heartburn, nausea, vomiting, abdominal pain.   Genitourinary: Negative for dysuria.   Musculoskeletal: Negative for back pain, falls Skin: Negative for itching and rash.   Neurological: Negative for dizziness and headaches.   Psychiatric/Behavioral: Negative for depression.    Medication reviewed. See [facility]  Past Medical History  Diagnosis Date  . Hypertension   . Depression   . GERD (gastroesophageal reflux disease)   . Hypercholesterolemia   . Glaucoma   . Cataracts, bilateral   . Cerebrovascular accident 2007   Physical exam BP 124/78 mmHg  Pulse 78  Temp(Src) 97.8 F (36.6 C)  Resp 18  SpO2 97%  General- elderly male in no acute distress Head- atraumatic, normocephalic Eyes- PERRLA, EOMI, no pallor, no icterus, no discharge Neck- no lymphadenopathy, no thyromegaly, no jugular vein distension Cardiovascular- normal s1,s2, no murmurs, normal distal pulses, no leg edema Respiratory- bilateral clear to auscultation, no wheeze, no rhonchi, no crackles Abdomen- bowel sounds present, soft, non tender, no CVA tenderness, scar of old peg tube Musculoskeletal- left  sided weakness with hemiparesis, LUE contracture at elbow and wrist joint, on wheelchair, RUE and RLE adequate strength, can self propel on WC Neuro- no new focal deficit, left sided hemiparesis, has slurred speech Psychiatry- alert and oriented to person  Labs   01/26/13 t.chol 165, tg 134, ldl 105, hdl 33, a1c 6, vit d 18.43 06/25/13 wbc 6.4, hb 13, hct 41.5, plt 235, na 139, k 4.2, cl 101, co2 33, bun 9, cr 0.9, glu 90, ca 9.1, a1c 6.2 11/23/13 a1c 6.1, t.chol 110, ldl 61, hdl 34, tg 74, na 139, k 4.1, bun 10, cr 0.9, glu 93, ca 9, wbc 5.8, hb 12.1, hct 39.3, plt 246  Assessment/plan  Dry eyes Continue artifical tears, this has been helpful  Depression Mood currently stable continue zoloft 25 mg daily, tolerating well. Failed GDR in past  HTN Stable, off all bp meds, bp at goal. Monitor  IBS with constipation On miralax, senna s, lactulose and linzess. D/c miralax. Continue other medication and monitor

## 2014-03-14 ENCOUNTER — Non-Acute Institutional Stay (SKILLED_NURSING_FACILITY): Payer: Medicare Other | Admitting: Internal Medicine

## 2014-03-14 DIAGNOSIS — K589 Irritable bowel syndrome without diarrhea: Secondary | ICD-10-CM | POA: Insufficient documentation

## 2014-03-14 DIAGNOSIS — D132 Benign neoplasm of duodenum: Secondary | ICD-10-CM

## 2014-03-14 DIAGNOSIS — L0212 Furuncle of neck: Secondary | ICD-10-CM

## 2014-03-14 DIAGNOSIS — E785 Hyperlipidemia, unspecified: Secondary | ICD-10-CM | POA: Insufficient documentation

## 2014-03-14 NOTE — Progress Notes (Signed)
Patient ID: Tyler Beard, male   DOB: 27-Sep-1934, 80 y.o.   MRN: [id number]    Facility: [facility]  No Known Allergies  Chief complaint- medical management of chronic issues  HPI 80 y/o male patient is seen for routine visit. He is currently on treatment for furuncle in his back area. He denies any fever or chills. He has been at his baseline without any concerns. No concerns from the staff. IBS has been stable. Weight has been stable. Lipid panel shows stable lipids.continues to participate with restorative therapy. He denies any neck pain.   Review of Systems   Constitutional: Negative for fever, chills, diaphoresis.   HENT: Negative for congestion   Respiratory: Negative for cough, shortness of breath and wheezing.    Cardiovascular: Negative for chest pain, palpitations, leg swelling.   Gastrointestinal: Negative for heartburn, nausea, vomiting, abdominal pain.   Genitourinary: Negative for dysuria.   Musculoskeletal: Negative for back pain, falls  Past Medical History  Diagnosis Date  . Hypertension   . Depression   . GERD (gastroesophageal reflux disease)   . Hypercholesterolemia   . Glaucoma   . Cataracts, bilateral   . Cerebrovascular accident 2007   Medication reviewed. See [facility]  Physical exam BP 130/67 mmHg  Pulse 70  Temp(Src) 98 F (36.7 C)  Resp 18  SpO2 97%  General- elderly male in no acute distress Head- atraumatic, normocephalic Eyes- PERRLA, EOMI, no pallor, no icterus, no discharge Neck- no lymphadenopathy, no thyromegaly, no jugular vein distension Cardiovascular- normal s1,s2, no murmurs, normal distal pulses, no leg edema Respiratory- bilateral clear to auscultation, no wheeze, no rhonchi, no crackles Abdomen- bowel sounds present, soft, non tender, no CVA tenderness, scar of old peg tube Musculoskeletal- left sided weakness with hemiparesis, LUE contracture at elbow and wrist joint, on wheelchair, RUE and  RLE adequate strength, can self propel on WC Neuro- no new focal deficit, left sided hemiparesis, has slurred speech Skin- furuncle has resolved Psychiatry- alert and oriented to person  Labs   01/26/13 t.chol 165, tg 134, ldl 105, hdl 33, a1c 6, vit d 18.43 06/25/13 wbc 6.4, hb 13, hct 41.5, plt 235, na 139, k 4.2, cl 101, co2 33, bun 9, cr 0.9, glu 90, ca 9.1, a1c 6.2 11/23/13 a1c 6.1, t.chol 110, ldl 61, hdl 34, tg 74, na 139, k 4.1, bun 10, cr 0.9, glu 93, ca 9, wbc 5.8, hb 12.1, hct 39.3, plt 246  Assessment/plan  Furuncle Improved, complete course of kelfex and florastor  IBS Stable, continue linzess, lactulose and senna  Hyperlipidemia Continue lipitor 10 mg daily  Benign neoplasm of duodenum F/u with gi on yearly basis, no signs of bowel obstruction at present, monitor clinically

## 2014-04-29 ENCOUNTER — Non-Acute Institutional Stay (SKILLED_NURSING_FACILITY): Payer: Medicare Other | Admitting: Internal Medicine

## 2014-04-29 DIAGNOSIS — F32A Depression, unspecified: Secondary | ICD-10-CM | POA: Insufficient documentation

## 2014-04-29 DIAGNOSIS — D509 Iron deficiency anemia, unspecified: Secondary | ICD-10-CM | POA: Diagnosis not present

## 2014-04-29 DIAGNOSIS — F329 Major depressive disorder, single episode, unspecified: Secondary | ICD-10-CM

## 2014-04-29 DIAGNOSIS — E559 Vitamin D deficiency, unspecified: Secondary | ICD-10-CM | POA: Diagnosis not present

## 2014-04-29 DIAGNOSIS — K269 Duodenal ulcer, unspecified as acute or chronic, without hemorrhage or perforation: Secondary | ICD-10-CM | POA: Diagnosis not present

## 2014-04-29 NOTE — Progress Notes (Signed)
Patient ID: Tyler Beard, male   DOB: May 08, 1934, 80 y.o.   MRN: [id number]    Facility: [facility]  No Known Allergies  Chief complaint- medical management of chronic issues  Code status: full code  HPI 80 y/o male patient is seen for routine visit. He is seen in his room today. He denies any concerns. No new concern from staff. No new skin concerns. No behavioral concerns  Review of Systems   Constitutional: Negative for fever, chills, diaphoresis.   HENT: Negative for congestion   Respiratory: Negative for cough, shortness of breath and wheezing.    Cardiovascular: Negative for chest pain, palpitations, leg swelling.   Gastrointestinal: Negative for heartburn, nausea, vomiting, abdominal pain.   Genitourinary: Negative for dysuria.   Musculoskeletal: Negative for back pain, falls  Past medical history reviewed  Medication reviewed  Physical exam BP 130/72 mmHg  Pulse 80  Temp(Src) 98.2 F (36.8 C)  Resp 19  General- elderly male in no acute distress Head- atraumatic, normocephalic Eyes- PERRLA, EOMI, no pallor, no icterus, no discharge Neck- no lymphadenopathy, no thyromegaly, no jugular vein distension Cardiovascular- normal s1,s2, no murmurs, normal distal pulses, no leg edema Respiratory- bilateral clear to auscultation, no wheeze, no rhonchi, no crackles Abdomen- bowel sounds present, soft, non tender, no CVA tenderness, scar of old peg tube Musculoskeletal- left sided weakness with hemiparesis, LUE contracture at elbow and wrist joint, on wheelchair, RUE and RLE adequate strength, can self propel on WC Neuro- no new focal deficit, left sided hemiparesis, has slurred speech Skin- warm and dry Psychiatry- alert and oriented to person  Labs   01/26/13 t.chol 165, tg 134, ldl 105, hdl 33, a1c 6, vit d 18.43 06/25/13 wbc 6.4, hb 13, hct 41.5, plt 235, na 139, k 4.2, cl 101, co2 33, bun 9, cr 0.9, glu 90, ca 9.1, a1c  6.2 11/23/13 a1c 6.1, t.chol 110, ldl 61, hdl 34, tg 74, na 139, k 4.1, bun 10, cr 0.9, glu 93, ca 9, wbc 5.8, hb 12.1, hct 39.3, plt 246  Assessment/plan  Duodenal ulcer Stable, continue protonix 40 mg daily with sucralfate. Hx of duodenal ulcer  Iron def anemia Off medication, hb stable in 10/15  Vitamin d def Continue vitamin d 4000 u daily, fall precautions. With him being mainly bedbound, add calcium supplement 1250 mg a day to prevent bone loss.   Depression Mood remains stable on zoloft 25 mg daily.

## 2014-05-27 ENCOUNTER — Non-Acute Institutional Stay (SKILLED_NURSING_FACILITY): Payer: Medicare Other | Admitting: Internal Medicine

## 2014-05-27 DIAGNOSIS — I119 Hypertensive heart disease without heart failure: Secondary | ICD-10-CM

## 2014-05-27 DIAGNOSIS — H04123 Dry eye syndrome of bilateral lacrimal glands: Secondary | ICD-10-CM

## 2014-05-27 DIAGNOSIS — K5901 Slow transit constipation: Secondary | ICD-10-CM

## 2014-05-27 NOTE — Progress Notes (Signed)
Patient ID: Tyler Beard, male   DOB: 1934-12-21, 80 y.o.   MRN: [id number]     Facility: [facility]  No Known Allergies  Chief complaint- medical management of chronic issues  Code status: full code  HPI 80 y/o male patient is seen for routine visit. He is seen in his room today. He denies any concerns. No new concern from staff. No new skin concerns. No behavioral concerns. He has PMH of CVA, HTN, PVD, depression among others. He is watching televison sitting on his wheelchair.  Review of Systems   Constitutional: Negative for fever, chills, diaphoresis.   HENT: Negative for congestion   Respiratory: Negative for cough, shortness of breath and wheezing.    Cardiovascular: Negative for chest pain, palpitations, leg swelling.   Gastrointestinal: Negative for heartburn, nausea, vomiting, abdominal pain.  had BM this am Genitourinary: Negative for dysuria.   Musculoskeletal: Negative for back pain, falls  Past medical history reviewed  Medication reviewed  Physical exam BP 126/70 mmHg  Pulse 63  Temp(Src) 97.9 F (36.6 C)  Resp 18  General- elderly male in no acute distress Head- atraumatic, normocephalic Eyes- PERRLA, EOMI, no pallor, no icterus, no discharge Neck- no lymphadenopathy, no thyromegaly, no jugular vein distension Cardiovascular- normal s1,s2, no murmurs, normal distal pulses, no leg edema Respiratory- bilateral clear to auscultation, no wheeze, no rhonchi, no crackles Abdomen- bowel sounds present, soft, non tender, no CVA tenderness, scar of old peg tube Musculoskeletal- left sided weakness with hemiparesis, LUE contracture at elbow and wrist joint, on wheelchair, RUE and RLE adequate strength, can self propel on WC Neuro- no new focal deficit, left sided hemiparesis, has slurred speech Skin- warm and dry Psychiatry- alert and oriented to person  Labs   01/26/13 t.chol 165, tg 134, ldl 105, hdl 33, a1c 6, vit d  18.43 06/25/13 wbc 6.4, hb 13, hct 41.5, plt 235, na 139, k 4.2, cl 101, co2 33, bun 9, cr 0.9, glu 90, ca 9.1, a1c 6.2 11/23/13 a1c 6.1, t.chol 110, ldl 61, hdl 34, tg 74, na 139, k 4.1, bun 10, cr 0.9, glu 93, ca 9, wbc 5.8, hb 12.1, hct 39.3, plt 246 04/16/14 wbc 6.9, hb 11.6, hct 37.5, plt 253, na 142, k 4.4, bun 11, cr 0.87, glu 73, t.chol 120, tg 94, ldl 64, hdl 37, tsh 2.61  Assessment/plan  Dry eyes Continue artificial tear drops  Hypertensive heart diease Controlled bp readings, off all medications, monitor  Constipation Stable, regular BM, continue current regimen of linzess

## 2014-06-24 ENCOUNTER — Non-Acute Institutional Stay (SKILLED_NURSING_FACILITY): Payer: Medicare Other | Admitting: Internal Medicine

## 2014-06-24 DIAGNOSIS — R634 Abnormal weight loss: Secondary | ICD-10-CM | POA: Insufficient documentation

## 2014-06-24 DIAGNOSIS — E785 Hyperlipidemia, unspecified: Secondary | ICD-10-CM | POA: Diagnosis not present

## 2014-06-24 DIAGNOSIS — K449 Diaphragmatic hernia without obstruction or gangrene: Secondary | ICD-10-CM | POA: Diagnosis not present

## 2014-06-24 DIAGNOSIS — I6350 Cerebral infarction due to unspecified occlusion or stenosis of unspecified cerebral artery: Secondary | ICD-10-CM | POA: Diagnosis not present

## 2014-06-24 DIAGNOSIS — K219 Gastro-esophageal reflux disease without esophagitis: Secondary | ICD-10-CM | POA: Diagnosis not present

## 2014-06-24 DIAGNOSIS — G8114 Spastic hemiplegia affecting left nondominant side: Secondary | ICD-10-CM

## 2014-06-24 DIAGNOSIS — IMO0002 Reserved for concepts with insufficient information to code with codable children: Secondary | ICD-10-CM

## 2014-06-24 DIAGNOSIS — F329 Major depressive disorder, single episode, unspecified: Secondary | ICD-10-CM | POA: Diagnosis not present

## 2014-06-24 DIAGNOSIS — G8110 Spastic hemiplegia affecting unspecified side: Secondary | ICD-10-CM | POA: Diagnosis not present

## 2014-06-24 NOTE — Progress Notes (Signed)
Patient ID: Tyler Beard, male   DOB: 1934-07-21, 80 y.o.   MRN: [id number]      Facility: [facility]  No Known Allergies  Chief complaint- medical management of chronic issues  Code status: full code  HPI 80 y/o male patient is seen for routine visit. He is seen in his room today. He complaints of tightness and spasm in his left arm and shoulder. No other concerns. He is sitting on wheelchair and is in no distress. No new concern from staff. No new skin concerns. No behavioral concerns. Lost 3 lbs since last month He has PMH of CVA, HTN, PVD, depression among others.   Review of Systems   Constitutional: Negative for fever, chills, diaphoresis.   HENT: Negative for congestion   Respiratory: Negative for cough, shortness of breath and wheezing.    Cardiovascular: Negative for chest pain, palpitations, leg swelling.   Gastrointestinal: Negative for heartburn, nausea, vomiting, abdominal pain.  had BM this am Genitourinary: Negative for dysuria.   Musculoskeletal: Negative for back pain, falls  Past medical history reviewed  Medication reviewed   Medication List       This list is accurate as of: 06/24/14  2:27 PM.  Always use your most recent med list.               acetaminophen 325 MG tablet  Commonly known as:  TYLENOL  Take 650 mg by mouth every 4 (four) hours as needed for mild pain, moderate pain, fever or headache.     atorvastatin 10 MG tablet  Commonly known as:  LIPITOR  Take 10 mg by mouth daily.     calcium carbonate 1250 (500 CA) MG chewable tablet  Commonly known as:  OS-CAL  Chew 1 tablet by mouth daily.     cholecalciferol 1000 UNITS tablet  Commonly known as:  VITAMIN D  Take 4,000 Units by mouth daily.     lactulose 10 GM/15ML solution  Commonly known as:  CHRONULAC  Take 10 g by mouth 2 (two) times daily. 15 ml bid     Linaclotide 145 MCG Caps capsule  Commonly known as:  LINZESS  Take 145 mcg by  mouth daily.     pantoprazole 40 MG tablet  Commonly known as:  PROTONIX  Take 40 mg by mouth daily. For GERD     sennosides-docusate sodium 8.6-50 MG tablet  Commonly known as:  SENOKOT-S  Take 1 tablet by mouth daily. For constipation     sertraline 25 MG tablet  Commonly known as:  ZOLOFT  Take 25 mg by mouth daily. For depression     sucralfate 1 G tablet  Commonly known as:  CARAFATE  Take 1 g by mouth 2 (two) times daily.     vitamin B-12 1000 MCG tablet  Commonly known as:  CYANOCOBALAMIN  Take 1,000 mcg by mouth daily.        Physical exam BP 140/84 mmHg  Pulse 78  Temp(Src) 98.5 F (36.9 C)  Resp 18  Ht 5\' 8"  (1.727 m)  Wt 193 lb (87.544 kg)  BMI 29.35 kg/m2  SpO2 96%  General- elderly male in no acute distress Head- atraumatic, normocephalic Eyes- PERRLA, EOMI, no pallor, no icterus, no discharge Neck- no lymphadenopathy, no thyromegaly, no jugular vein distension Cardiovascular- normal s1,s2, no murmurs, normal distal pulses, no leg edema Respiratory- bilateral clear to auscultation, no wheeze, no rhonchi, no crackles Abdomen- bowel sounds present, soft, non tender,  no CVA tenderness, scar of old peg tube Musculoskeletal- left sided weakness with hemiparesis, LUE contracture at elbow and wrist joint, on wheelchair, spasticity in left leg and arm, RUE and RLE adequate strength, can self propel on WC Neuro- no new focal deficit, left sided hemiparesis, has slurred speech Skin- warm and dry Psychiatry- alert and oriented to person  Labs   01/26/13 t.chol 165, tg 134, ldl 105, hdl 33, a1c 6, vit d 18.43 06/25/13 wbc 6.4, hb 13, hct 41.5, plt 235, na 139, k 4.2, cl 101, co2 33, bun 9, cr 0.9, glu 90, ca 9.1, a1c 6.2 11/23/13 a1c 6.1, t.chol 110, ldl 61, hdl 34, tg 74, na 139, k 4.1, bun 10, cr 0.9, glu 93, ca 9, wbc 5.8, hb 12.1, hct 39.3, plt 246 04/16/14 wbc 6.9, hb 11.6, hct 37.5, plt 253, na 142, k 4.4, bun 11, cr 0.87, glu 73, t.chol 120, tg 94, ldl 64, hdl  37, tsh 2.61  Assessment/plan  Left sided spasticity Post cva with left sided weakness. On tylenol 650 mg q4h prn pain. Add baclofen 10 mg bid for muscle spasm and reassess  gerd with hiatal hernia Stable. On protonix 40 mg daily, attempt dose reduction to 20 mg daily now. Continue carafate 1 gm bid  Weight loss Lost 2 lbs over a month. BMI normal. Good appetite. Normal albumin level on review. Check prealbumin level to assess for nutritional state.   Hyperlipidemia ldl at goal. Continue lipitor 10 mg daily  Depressive disorder Stable mood. Continue zoloft 25 mg daily   Blanchie Serve, MD  [facility] [phone] (Monday-Friday 8 am - 5 pm) [phone] (afterhours)

## 2014-07-11 ENCOUNTER — Encounter: Payer: Self-pay | Admitting: Gastroenterology

## 2014-07-17 LAB — CBC AND DIFFERENTIAL
HEMATOCRIT: 33 % — AB (ref 41–53)
HEMOGLOBIN: 10.5 g/dL — AB (ref 13.5–17.5)
Platelets: 254 10*3/uL (ref 150–399)
WBC: 6.5 10^3/mL

## 2014-07-17 LAB — HEMOGLOBIN A1C: Hgb A1c MFr Bld: 6.5 % — AB (ref 4.0–6.0)

## 2014-07-17 LAB — BASIC METABOLIC PANEL
BUN: 10 mg/dL (ref 4–21)
Creatinine: 0.9 mg/dL (ref 0.6–1.3)
POTASSIUM: 4.1 mmol/L (ref 3.4–5.3)
SODIUM: 140 mmol/L (ref 137–147)

## 2014-07-17 LAB — HEPATIC FUNCTION PANEL
ALK PHOS: 77 U/L (ref 25–125)
ALT: 8 U/L — AB (ref 10–40)
AST: 10 U/L — AB (ref 14–40)
BILIRUBIN, TOTAL: 0.4 mg/dL

## 2014-07-29 ENCOUNTER — Encounter: Payer: Self-pay | Admitting: Internal Medicine

## 2014-07-29 ENCOUNTER — Non-Acute Institutional Stay (SKILLED_NURSING_FACILITY): Payer: Medicare Other | Admitting: Internal Medicine

## 2014-07-29 DIAGNOSIS — IMO0002 Reserved for concepts with insufficient information to code with codable children: Secondary | ICD-10-CM

## 2014-07-29 DIAGNOSIS — M179 Osteoarthritis of knee, unspecified: Secondary | ICD-10-CM

## 2014-07-29 DIAGNOSIS — E785 Hyperlipidemia, unspecified: Secondary | ICD-10-CM | POA: Diagnosis not present

## 2014-07-29 DIAGNOSIS — E119 Type 2 diabetes mellitus without complications: Secondary | ICD-10-CM

## 2014-07-29 DIAGNOSIS — M1710 Unilateral primary osteoarthritis, unspecified knee: Secondary | ICD-10-CM

## 2014-09-02 LAB — CBC AND DIFFERENTIAL
HEMATOCRIT: 33 % — AB (ref 41–53)
Hemoglobin: 10.3 g/dL — AB (ref 13.5–17.5)
Platelets: 224 10*3/uL (ref 150–399)
WBC: 7.7 10^3/mL

## 2014-09-10 ENCOUNTER — Non-Acute Institutional Stay (SKILLED_NURSING_FACILITY): Payer: Medicare Other | Admitting: Internal Medicine

## 2014-09-10 ENCOUNTER — Encounter: Payer: Self-pay | Admitting: Internal Medicine

## 2014-09-10 DIAGNOSIS — D509 Iron deficiency anemia, unspecified: Secondary | ICD-10-CM

## 2014-09-10 DIAGNOSIS — K5900 Constipation, unspecified: Secondary | ICD-10-CM

## 2014-09-10 DIAGNOSIS — K219 Gastro-esophageal reflux disease without esophagitis: Secondary | ICD-10-CM | POA: Diagnosis not present

## 2014-09-10 DIAGNOSIS — K5909 Other constipation: Secondary | ICD-10-CM

## 2014-09-10 NOTE — Progress Notes (Signed)
Patient ID: Tyler Beard, male   DOB: 12-29-1934, 80 y.o.   MRN: [id number]       Facility: [facility]  No Known Allergies  Chief complaint- medical management of chronic issues  Code status: full code  HPI 80 y/o male patient is seen for routine visit. No new concern from patient and nursing. Drop in hb to 10.3 from 11.6 and is pneding gi appointment with hx of gi bleed. He denies any abdominal pain, nausea, vomiting or heartburn. No new skin concerns. No behavioral concerns. He has PMH of CVA, HTN, PVD, depression among others.   Review of Systems   Constitutional: Negative for fever, chills, diaphoresis.   HENT: Negative for congestion   Respiratory: Negative for cough, shortness of breath and wheezing.    Cardiovascular: Negative for chest pain, palpitations, leg swelling.   Gastrointestinal: Negative for heartburn, nausea, vomiting, abdominal pain. No melena or rectal bleed Genitourinary: Negative for dysuria.   Musculoskeletal: Negative for back pain, falls  Past medical history reviewed  Medication reviewed   Medication List       This list is accurate as of: 09/10/14  2:39 PM.  Always use your most recent med list.               acetaminophen 325 MG tablet  Commonly known as:  TYLENOL  Take 650 mg by mouth every 4 (four) hours as needed for mild pain, moderate pain, fever or headache.     atorvastatin 10 MG tablet  Commonly known as:  LIPITOR  Take 10 mg by mouth daily.     baclofen 10 MG tablet  Commonly known as:  LIORESAL  Take 10 mg by mouth 2 (two) times daily. For Spastic Hemiparesis     calcium carbonate 1250 (500 CA) MG chewable tablet  Commonly known as:  OS-CAL  Chew 1 tablet by mouth daily.     ferrous gluconate 324 MG tablet  Commonly known as:  FERGON  Take 324 mg by mouth 2 (two) times daily with a meal.     lactulose 10 GM/15ML solution  Commonly known as:  CHRONULAC  15 ml twice daily     Linaclotide 145 MCG Caps capsule  Commonly known as:  LINZESS  Take 145 mcg by mouth daily.     pantoprazole 40 MG tablet  Commonly known as:  PROTONIX  Take 40 mg by mouth daily. For GERD     sennosides-docusate sodium 8.6-50 MG tablet  Commonly known as:  SENOKOT-S  Take 1 tablet by mouth 2 (two) times daily. For constipation     sertraline 25 MG tablet  Commonly known as:  ZOLOFT  Take 25 mg by mouth daily. For depression     sucralfate 1 G tablet  Commonly known as:  CARAFATE  Take 1 g by mouth 2 (two) times daily.     vitamin B-12 500 MCG tablet  Commonly known as:  CYANOCOBALAMIN  Take 500 mcg by mouth daily.     Vitamin D 2000 UNITS Caps  Take 4,000 Units by mouth daily. For Vitamin D Deficiency        Physical exam BP 125/79 mmHg  Pulse 71  Temp(Src) 99.1 F (37.3 C) (Oral)  Resp 24  Ht 5\' 8"  (1.727 m)  Wt 201 lb 3.2 oz (91.264 kg)  BMI 30.60 kg/m2  SpO2 95%  General- elderly male in no acute distress Head- atraumatic, normocephalic Eyes- PERRLA, EOMI, no pallor, no  icterus, no discharge Neck- no lymphadenopathy, no thyromegaly, no jugular vein distension Cardiovascular- normal s1,s2, no murmurs, normal distal pulses, no leg edema Respiratory- bilateral clear to auscultation, no wheeze, no rhonchi, no crackles Abdomen- bowel sounds present, soft, non tender, no CVA tenderness, scar of old peg tube Musculoskeletal- left sided weakness with hemiparesis, LUE contracture at elbow and wrist joint, on wheelchair, spasticity in left leg and arm, RUE and RLE adequate strength, can self propel on WC Neuro- no new focal deficit, left sided hemiparesis, has slurred speech Skin- warm and dry Psychiatry- alert and oriented to person  Labs   01/26/13 t.chol 165, tg 134, ldl 105, hdl 33, a1c 6, vit d 18.43 06/25/13 wbc 6.4, hb 13, hct 41.5, plt 235, na 139, k 4.2, cl 101, co2 33, bun 9, cr 0.9, glu 90, ca 9.1, a1c 6.2 11/23/13 a1c 6.1, t.chol 110, ldl 61, hdl 34, tg 74,  na 139, k 4.1, bun 10, cr 0.9, glu 93, ca 9, wbc 5.8, hb 12.1, hct 39.3, plt 246 04/16/14 wbc 6.9, hb 11.6, hct 37.5, plt 253, na 142, k 4.4, bun 11, cr 0.87, glu 73, t.chol 120, tg 94, ldl 64, hdl 37, tsh 2.61 09/02/14 wbc 7.7, hb 10.3, plt 224, mcv 80.3  Assessment/plan  Iron deficiency anemia Further drop in hb, started on ferrous sulfate 325 mg bid recently. Continue this.   gerd With hx of duodenal ulcers and gi bleed. With drop in hb and concern for bleed, pending gi referral. Continue protonix 40 mg daily with carafate and monitor h&h  Constipation Stable, continue senokot s, lactulose and linzess for now   Blanchie Serve, MD  [place] (Monday-Friday 8 am - 5 pm) [phone] (afterhours)

## 2014-09-11 LAB — CBC AND DIFFERENTIAL
HEMATOCRIT: 35 % — AB (ref 41–53)
HEMOGLOBIN: 11.1 g/dL — AB (ref 13.5–17.5)
PLATELETS: 232 10*3/uL (ref 150–399)
WBC: 6.5 10*3/mL

## 2014-09-14 DIAGNOSIS — K5909 Other constipation: Secondary | ICD-10-CM | POA: Insufficient documentation

## 2014-09-14 NOTE — Progress Notes (Signed)
Patient ID: Tyler Beard, male   DOB: 01-22-1935, 80 y.o.   MRN: [id number]       Facility: [facility]  No Known Allergies  Chief complaint- medical management of chronic issues  Code status: full code  HPI 80 y/o male patient is seen for routine visit. He is seen in his room today. He has been at his baseline. a1c suggestive fo dm with 6.5. No wounds reported. Reviewed hb. No new concern from staff. No new skin concerns. He has PMH of CVA, HTN, PVD, depression among others.   Review of Systems   Constitutional: Negative for fever, chills, diaphoresis.   HENT: Negative for congestion   Respiratory: Negative for cough, shortness of breath and wheezing.    Cardiovascular: Negative for chest pain, palpitations, leg swelling.   Gastrointestinal: Negative for heartburn, nausea, vomiting, abdominal pain, melena or rectal bleed Genitourinary: Negative for dysuria.   Musculoskeletal: Negative for back pain, falls  Past medical history reviewed  Medication reviewed   Medication List       This list is accurate as of: 07/29/14 11:59 PM.  Always use your most recent med list.               acetaminophen 325 MG tablet  Commonly known as:  TYLENOL  Take 650 mg by mouth every 4 (four) hours as needed for mild pain, moderate pain, fever or headache.     atorvastatin 10 MG tablet  Commonly known as:  LIPITOR  Take 10 mg by mouth daily.     baclofen 10 MG tablet  Commonly known as:  LIORESAL  Take 10 mg by mouth 2 (two) times daily. For Spastic Hemiparesis     calcium carbonate 1250 (500 CA) MG chewable tablet  Commonly known as:  OS-CAL  Chew 1 tablet by mouth daily.     lactulose 10 GM/15ML solution  Commonly known as:  CHRONULAC  15 ml twice daily     Linaclotide 145 MCG Caps capsule  Commonly known as:  LINZESS  Take 145 mcg by mouth daily.     pantoprazole 40 MG tablet  Commonly known as:  PROTONIX  Take 40 mg by mouth daily.  For GERD     sennosides-docusate sodium 8.6-50 MG tablet  Commonly known as:  SENOKOT-S  Take 1 tablet by mouth 2 (two) times daily. For constipation     sertraline 25 MG tablet  Commonly known as:  ZOLOFT  Take 25 mg by mouth daily. For depression     sucralfate 1 G tablet  Commonly known as:  CARAFATE  Take 1 g by mouth 2 (two) times daily.     vitamin B-12 500 MCG tablet  Commonly known as:  CYANOCOBALAMIN  Take 500 mcg by mouth daily.     Vitamin D 2000 UNITS Caps  Take 4,000 Units by mouth daily. For Vitamin D Deficiency        Physical exam BP 130/69 mmHg  Pulse 80  Temp(Src) 98.4 F (36.9 C)  Resp 16  General- elderly male in no acute distress Head- atraumatic, normocephalic Eyes- PERRLA, EOMI, no pallor, no icterus, no discharge Neck- no lymphadenopathy, no thyromegaly, no jugular vein distension Cardiovascular- normal s1,s2, no murmurs, normal distal pulses, no leg edema Respiratory- bilateral clear to auscultation, no wheeze, no rhonchi, no crackles Abdomen- bowel sounds present, soft, non tender, no CVA tenderness, scar of old peg tube Musculoskeletal- left sided weakness with hemiparesis, LUE contracture at  elbow and wrist joint, on wheelchair, spasticity in left leg and arm, RUE and RLE adequate strength, can self propel on WC Neuro- no new focal deficit, left sided hemiparesis, has slurred speech Skin- warm and dry Psychiatry- alert and oriented to person  Labs   01/26/13 t.chol 165, tg 134, ldl 105, hdl 33, a1c 6, vit d 18.43 06/25/13 wbc 6.4, hb 13, hct 41.5, plt 235, na 139, k 4.2, cl 101, co2 33, bun 9, cr 0.9, glu 90, ca 9.1, a1c 6.2 11/23/13 a1c 6.1, t.chol 110, ldl 61, hdl 34, tg 74, na 139, k 4.1, bun 10, cr 0.9, glu 93, ca 9, wbc 5.8, hb 12.1, hct 39.3, plt 246 04/16/14 wbc 6.9, hb 11.6, hct 37.5, plt 253, na 142, k 4.4, bun 11, cr 0.87, glu 73, t.chol 120, tg 94, ldl 64, hdl 37, tsh 2.61 07/17/14 a1c 6.5, wbc 6.5, hb 10.5, plt 254, cr  0.9  Assessment/plan  Type 2 dm Diet controlled. Continue statin. Will need urine microalbumin/ creatinine ratio checked to consider introducing ACEI/ ARB  Hyperlipidemia Continue lipitor 10 mg daily  OA Continue prn tylenol with calcium and vitamin d  Blanchie Serve, MD  [facility] [phone] (Monday-Friday 8 am - 5 pm) [phone] (afterhours)

## 2014-09-14 NOTE — Progress Notes (Signed)
This encounter was created in error - please disregard.

## 2014-09-19 ENCOUNTER — Ambulatory Visit: Payer: BC Managed Care – PPO | Admitting: Gastroenterology

## 2014-10-09 ENCOUNTER — Encounter: Payer: Self-pay | Admitting: Internal Medicine

## 2014-10-09 ENCOUNTER — Non-Acute Institutional Stay (SKILLED_NURSING_FACILITY): Payer: Medicare Other | Admitting: Internal Medicine

## 2014-10-09 DIAGNOSIS — D509 Iron deficiency anemia, unspecified: Secondary | ICD-10-CM

## 2014-10-09 DIAGNOSIS — G8110 Spastic hemiplegia affecting unspecified side: Secondary | ICD-10-CM | POA: Diagnosis not present

## 2014-10-09 DIAGNOSIS — E785 Hyperlipidemia, unspecified: Secondary | ICD-10-CM | POA: Diagnosis not present

## 2014-10-09 DIAGNOSIS — G8114 Spastic hemiplegia affecting left nondominant side: Secondary | ICD-10-CM

## 2014-10-09 NOTE — Progress Notes (Signed)
Patient ID: Tyler Beard, male   DOB: 04/01/1934, 80 y.o.   MRN: [id number]       Facility: [facility]  No Known Allergies  Chief complaint- medical management of chronic issues  Code status: full code  HPI 80 y/o male patient is seen for routine visit. He has been at his baseline. No new concern from patient and nursing staff. Patient seen by GI team on 10/01/14 and is pending EGD and colonoscopy. He is currently on iron supplement.   Review of Systems   Constitutional: Negative for fever, chills, diaphoresis.   HENT: Negative for congestion   Respiratory: Negative for cough, shortness of breath and wheezing.    Cardiovascular: Negative for chest pain, palpitations, leg swelling.   Gastrointestinal: Negative for heartburn, nausea, vomiting, abdominal pain. No melena or rectal bleed Genitourinary: Negative for dysuria.   Musculoskeletal: Negative for back pain, falls  Past medical history reviewed  Medication reviewed   Medication List       This list is accurate as of: 10/09/14 12:38 PM.  Always use your most recent med list.               acetaminophen 325 MG tablet  Commonly known as:  TYLENOL  Take 650 mg by mouth every 4 (four) hours as needed for mild pain, moderate pain, fever or headache.     atorvastatin 10 MG tablet  Commonly known as:  LIPITOR  Take one tablet by mouth once daily for cholesterol/CVA     baclofen 10 MG tablet  Commonly known as:  LIORESAL  Take 10 mg by mouth 2 (two) times daily. For Spastic Hemiparesis     calcium carbonate 1250 (500 CA) MG chewable tablet  Commonly known as:  OS-CAL  Chew 1 tablet by mouth daily.     ferrous sulfate 325 (65 FE) MG tablet  Take one tablet by mouth twice daily for anemia     lactulose 10 GM/15ML solution  Commonly known as:  CHRONULAC  Take 54ml by mouth twice daily for constipation     Linaclotide 145 MCG Caps capsule  Commonly known as:  LINZESS  Take one  capsule by mouth 30 minutes prior to fist meal on empty stomach once daily for constipation     pantoprazole 40 MG tablet  Commonly known as:  PROTONIX  Take 40 mg by mouth daily. For GERD     sennosides-docusate sodium 8.6-50 MG tablet  Commonly known as:  SENOKOT-S  Take 1 tablet by mouth 2 (two) times daily. For constipation     sertraline 25 MG tablet  Commonly known as:  ZOLOFT  Take one tablet by mouth every morning for depression     sucralfate 1 G tablet  Commonly known as:  CARAFATE  Take one tablet by mouth twice daily for reflux     vitamin B-12 500 MCG tablet  Commonly known as:  CYANOCOBALAMIN  Take 500 mcg by mouth daily.     Vitamin D 2000 UNITS Caps  Take 4,000 Units by mouth daily. For Vitamin D Deficiency        Physical exam BP 140/86 mmHg  Pulse 74  Temp(Src) 98.6 F (37 C) (Oral)  Resp 18  Ht 5\' 8"  (1.727 m)  Wt 201 lb 12.8 oz (91.536 kg)  BMI 30.69 kg/m2  SpO2 95%  General- elderly male in no acute distress Head- atraumatic, normocephalic Eyes- PERRLA, EOMI, no pallor, no icterus, no discharge Neck-  no lymphadenopathy, no thyromegaly, no jugular vein distension Cardiovascular- normal s1,s2, no murmurs, normal distal pulses, no leg edema Respiratory- bilateral clear to auscultation, no wheeze, no rhonchi, no crackles Abdomen- bowel sounds present, soft, non tender, no CVA tenderness, scar of old peg tube Musculoskeletal- left sided weakness with hemiparesis, LUE contracture at elbow and wrist joint, on wheelchair, spasticity in left leg and arm, RUE and RLE adequate strength, can self propel on WC Neuro- no new focal deficit, left sided hemiparesis, has slurred speech Skin- warm and dry Psychiatry- alert and oriented to person  Labs   01/26/13 t.chol 165, tg 134, ldl 105, hdl 33, a1c 6, vit d 18.43 06/25/13 wbc 6.4, hb 13, hct 41.5, plt 235, na 139, k 4.2, cl 101, co2 33, bun 9, cr 0.9, glu 90, ca 9.1, a1c 6.2 11/23/13 a1c 6.1, t.chol 110, ldl  61, hdl 34, tg 74, na 139, k 4.1, bun 10, cr 0.9, glu 93, ca 9, wbc 5.8, hb 12.1, hct 39.3, plt 246 04/16/14 wbc 6.9, hb 11.6, hct 37.5, plt 253, na 142, k 4.4, bun 11, cr 0.87, glu 73, t.chol 120, tg 94, ldl 64, hdl 37, tsh 2.61 09/02/14 wbc 7.7, hb 10.3, plt 224, mcv 80.3 09/11/14 hb 11.1, hct 35  Assessment/plan  Iron deficiency anemia Continue ferrous sulfate 325 mg bid. Hb 11.1 on 09/11/14. Pending GI workup EGD and colonoscopy to evaluate for source of bleeding, continue PPI  Left spastic hemiparesis Continue baclofen 10 mg bid and prn tylenol for pain, continue to wear wrist brace  Hyperlipidemia LDL at goal in march, continue lipitor 10 mg daily for now and monitor   Blanchie Serve, MD  [facility] Adult [facility] [phone] (Monday-Friday 8 am - 5 pm) [phone] (afterhours)

## 2014-11-12 ENCOUNTER — Non-Acute Institutional Stay (SKILLED_NURSING_FACILITY): Payer: Medicare Other | Admitting: Internal Medicine

## 2014-11-12 DIAGNOSIS — K5900 Constipation, unspecified: Secondary | ICD-10-CM | POA: Diagnosis not present

## 2014-11-12 DIAGNOSIS — K269 Duodenal ulcer, unspecified as acute or chronic, without hemorrhage or perforation: Secondary | ICD-10-CM | POA: Diagnosis not present

## 2014-11-12 DIAGNOSIS — E538 Deficiency of other specified B group vitamins: Secondary | ICD-10-CM

## 2014-11-12 DIAGNOSIS — K5909 Other constipation: Secondary | ICD-10-CM

## 2014-11-12 NOTE — Progress Notes (Signed)
Patient ID: Tyler Beard, male   DOB: 11/12/1934, 80 y.o.   MRN: [id number]       Facility: [facility]  No Known Allergies  Chief complaint- medical management of chronic issues  Code status: full code  HPI 80 y/o male patient is seen for routine visit. He has been at his baseline. No new concern from patient and nursing staff.    Review of Systems   Constitutional: Negative for fever, chills, diaphoresis.   HENT: Negative for congestion   Respiratory: Negative for cough, shortness of breath and wheezing.    Cardiovascular: Negative for chest pain, palpitations Gastrointestinal: Negative for heartburn, nausea, vomiting, abdominal pain.  Genitourinary: Negative for dysuria.   Musculoskeletal: Negative for back pain, falls  Past medical history reviewed  Medication reviewed: See [facility]  Physical exam BP 124/76 mmHg  Pulse 70  Temp(Src) 98 F (36.7 C)  Resp 18  Wt 198 lb 12.8 oz (90.175 kg)  SpO2 96%  General- elderly male in no acute distress Head- atraumatic, normocephalic Eyes- PERRLA, EOMI, no pallor, no icterus, no discharge Neck- no lymphadenopathy, no thyromegaly, no jugular vein distension Cardiovascular- normal s1,s2, no murmurs, normal distal pulses, no leg edema Respiratory- bilateral clear to auscultation, no wheeze, no rhonchi, no crackles Abdomen- bowel sounds present, soft, non tender, no CVA tenderness, scar of old peg tube Musculoskeletal- left sided weakness with hemiparesis, LUE contracture at elbow and wrist joint, on wheelchair, spasticity in left leg and arm, RUE and RLE adequate strength, can self propel on WC Neuro- no new focal deficit, left sided hemiparesis, has slurred speech Skin- warm and dry Psychiatry- alert and oriented to person  Labs   11/23/13 a1c 6.1, t.chol 110, ldl 61, hdl 34, tg 74, na 139, k 4.1, bun 10, cr 0.9, glu 93, ca 9, wbc 5.8, hb 12.1, hct 39.3, plt 246 04/16/14 wbc 6.9, hb 11.6, hct  37.5, plt 253, na 142, k 4.4, bun 11, cr 0.87, glu 73, t.chol 120, tg 94, ldl 64, hdl 37, tsh 2.61 09/02/14 wbc 7.7, hb 10.3, plt 224, mcv 80.3 09/11/14 hb 11.1, hct 35  Assessment/plan  gerd With hx of duodenal ulcers and gi bleed. Continue protonix 40 mg daily with carafate 1 g bid.   Constipation Stable, continue senokot s, lactulose and linzess for now. On iron supplement which can worsen this. Encourage hydration  b12 def Continue b12 supplement 500 mcg daily and monitor  Blanchie Serve, MD  [facility] Adult [facility] [phone] (Monday-Friday 8 am - 5 pm) [phone] (afterhours)

## 2014-12-17 ENCOUNTER — Encounter: Payer: Self-pay | Admitting: *Deleted

## 2014-12-17 LAB — CBC AND DIFFERENTIAL
HCT: 42 % (ref 41–53)
Hemoglobin: 12.9 g/dL — AB (ref 13.5–17.5)
Platelets: 235 10*3/uL (ref 150–399)
WBC: 6.8 10*3/mL

## 2014-12-17 LAB — BASIC METABOLIC PANEL
BUN: 8 mg/dL (ref 4–21)
CREATININE: 1 mg/dL (ref 0.6–1.3)
GLUCOSE: 104 mg/dL
POTASSIUM: 4.2 mmol/L (ref 3.4–5.3)
Sodium: 142 mmol/L (ref 137–147)

## 2014-12-17 LAB — HEPATIC FUNCTION PANEL
ALT: 8 U/L — AB (ref 10–40)
AST: 10 U/L — AB (ref 14–40)
Alkaline Phosphatase: 68 U/L (ref 25–125)
Bilirubin, Total: 0.4 mg/dL

## 2014-12-22 DIAGNOSIS — K269 Duodenal ulcer, unspecified as acute or chronic, without hemorrhage or perforation: Secondary | ICD-10-CM | POA: Insufficient documentation

## 2014-12-22 DIAGNOSIS — E538 Deficiency of other specified B group vitamins: Secondary | ICD-10-CM | POA: Insufficient documentation

## 2015-03-20 ENCOUNTER — Non-Acute Institutional Stay (SKILLED_NURSING_FACILITY): Payer: Medicare Other | Admitting: Internal Medicine

## 2015-03-20 ENCOUNTER — Encounter: Payer: Self-pay | Admitting: Internal Medicine

## 2015-03-20 DIAGNOSIS — G4700 Insomnia, unspecified: Secondary | ICD-10-CM

## 2015-03-20 DIAGNOSIS — E785 Hyperlipidemia, unspecified: Secondary | ICD-10-CM

## 2015-03-20 DIAGNOSIS — K269 Duodenal ulcer, unspecified as acute or chronic, without hemorrhage or perforation: Secondary | ICD-10-CM | POA: Diagnosis not present

## 2015-03-20 DIAGNOSIS — G8102 Flaccid hemiplegia affecting left dominant side: Secondary | ICD-10-CM

## 2015-03-20 DIAGNOSIS — D509 Iron deficiency anemia, unspecified: Secondary | ICD-10-CM

## 2015-03-20 NOTE — Progress Notes (Signed)
Patient ID: Tyler Beard, male   DOB: [birth year], 81 y.o.   MRN: [id number]       Facility: [facility]  No Known Allergies  Chief complaint- medical management of chronic issues  Code status: Full Code  HPI 81 y/o male patient is seen for routine visit. He has been at his baseline. No new concern from patient this visit. No acute behavior changes per nursing. No falls reported.    Review of Systems   Constitutional: Negative for fever, chills, diaphoresis.   HENT: Negative for congestion   Respiratory: Negative for cough, shortness of breath and wheezing.    Cardiovascular: Negative for chest pain, palpitations Gastrointestinal: Negative for heartburn, nausea, vomiting, abdominal pain.  Genitourinary: Negative for dysuria.   Musculoskeletal: Negative for back pain, falls  Past medical history reviewed    Medication List       This list is accurate as of: 03/20/15  5:02 PM.  Always use your most recent med list.               atorvastatin 10 MG tablet  Commonly known as:  LIPITOR  Take one tablet by mouth once daily for cholesterol/CVA     baclofen 10 MG tablet  Commonly known as:  LIORESAL  Take 10 mg by mouth 2 (two) times daily. For Spastic Hemiparesis     calcium carbonate 1250 (500 Ca) MG chewable tablet  Commonly known as:  OS-CAL  Chew 1 tablet by mouth daily.     ferrous sulfate 325 (65 FE) MG tablet  Take one tablet by mouth twice daily for anemia     lactulose 10 GM/15ML solution  Commonly known as:  CHRONULAC  Take 51ml by mouth twice daily for constipation     Linaclotide 145 MCG Caps capsule  Commonly known as:  LINZESS  Take one capsule by mouth 30 minutes prior to fist meal on empty stomach once daily for constipation     Melatonin 3 MG Tabs  Take 3 mg by mouth daily.     pantoprazole 40 MG tablet  Commonly known as:  PROTONIX  Take 40 mg by mouth daily. For GERD     sennosides-docusate sodium  8.6-50 MG tablet  Commonly known as:  SENOKOT-S  Take 1 tablet by mouth 2 (two) times daily. For constipation     sertraline 25 MG tablet  Commonly known as:  ZOLOFT  Take one tablet by mouth every morning for depression     sucralfate 1 g tablet  Commonly known as:  CARAFATE  Take one tablet by mouth twice daily for reflux     SYSTANE BALANCE OP  Apply 1 drop to eye 3 (three) times daily. Both eyes     SYSTANE BALANCE OP  Apply 1 drop to eye. 1 drop OU for dry eyes     vitamin B-12 500 MCG tablet  Commonly known as:  CYANOCOBALAMIN  Take 500 mcg by mouth daily.     Vitamin D 2000 units Caps  Take 4,000 Units by mouth daily. For Vitamin D Deficiency        Physical exam BP 125/65 mmHg  Pulse 71  Temp(Src) 97.6 F (36.4 C) (Oral)  Resp 20  Ht 5\' 8"  (1.727 m)  SpO2 98%   Wt Readings from Last 3 Encounters:  11/12/14 198 lb 12.8 oz (90.175 kg)  10/09/14 201 lb 12.8 oz (91.536 kg)  09/10/14 201 lb 3.2 oz (91.264 kg)  General- elderly male in no acute distress Head- atraumatic, normocephalic Eyes- PERRLA, EOMI, no pallor, no icterus, no discharge Neck- no lymphadenopathy, no thyromegaly, no jugular vein distension Cardiovascular- normal s1,s2, no murmurs, normal distal pulses, no leg edema Respiratory- bilateral clear to auscultation, no wheeze, no rhonchi, no crackles Abdomen- bowel sounds present, soft, non tender, no CVA tenderness, scar of old peg tube Musculoskeletal- left sided weakness with hemiparesis, LUE contracture at elbow and wrist joint, splint in place on wheelchair, spasticity in left leg and arm, RUE and RLE adequate strength, can self propel on WC Neuro- no new focal deficit, left sided hemiparesis, has slurred speech Skin- warm and dry Psychiatry- alert and oriented to person  Labs   CBC Latest Ref Rng 12/17/2014 09/11/2014 09/02/2014  WBC - 6.8 6.5 7.7  Hemoglobin 13.5 - 17.5 g/dL 12.9(A) 11.1(A) 10.3(A)  Hematocrit 41 - 53 % 42 35(A) 33(A)    Platelets 150 - 399 K/L 235 232 224   CMP Latest Ref Rng 12/17/2014 07/17/2014 11/26/2013  Glucose 70 - 99 mg/dL - - -  BUN 4 - 21 mg/dL 8 10 10   Creatinine 0.6 - 1.3 mg/dL 1.0 0.9 0.9  Sodium 137 - 147 mmol/L 142 140 139  Potassium 3.4 - 5.3 mmol/L 4.2 4.1 4.1  Chloride 96 - 112 mEq/L - - -  CO2 19 - 32 mEq/L - - -  Calcium 8.4 - 10.5 mg/dL - - -  Total Protein 6.0 - 8.3 g/dL - - -  Total Bilirubin 0.3 - 1.2 mg/dL - - -  Alkaline Phos 25 - 125 U/L 68 77 -  AST 14 - 40 U/L 10(A) 10(A) 9(A)  ALT 10 - 40 U/L 8(A) 8(A) 9(A)   Lipid Panel     Component Value Date/Time   CHOL 110 11/26/2013   TRIG 74 11/26/2013   HDL 34* 11/26/2013   LDLCALC 61 11/26/2013   Lab Results  Component Value Date   HGBA1C 6.5* 07/17/2014   04/16/14  t.chol 120, tg 94, ldl 64, hdl 37, tsh 2.61   Assessment/plan  Hyperlipidemia Check lipid panel. Continue lipitor 10 mg daily  duodenal ulcers  With gi bleed. Continue protonix 40 mg daily with carafate 1 g bid.   Iron def anemia Continue ferrous sulfate 325 mg bid  Flaccid hemiplegia Continue baclofen and lipitor. Check lipid panel  Insomnia Melatonin has been helpful, continue this  Blanchie Serve, MD  [facility] [phone] (Monday-Friday 8 am - 5 pm) [phone] (afterhours)

## 2015-03-21 LAB — LIPID PANEL
Cholesterol: 126 mg/dL (ref 0–200)
HDL: 29 mg/dL — AB (ref 35–70)
LDL Cholesterol: 73 mg/dL
Triglycerides: 120 mg/dL (ref 40–160)

## 2015-04-09 LAB — HEMOGLOBIN A1C: Hemoglobin A1C: 6.3

## 2015-04-24 ENCOUNTER — Encounter: Payer: Self-pay | Admitting: Internal Medicine

## 2015-04-24 ENCOUNTER — Non-Acute Institutional Stay (SKILLED_NURSING_FACILITY): Payer: Medicare Other | Admitting: Internal Medicine

## 2015-04-24 DIAGNOSIS — J302 Other seasonal allergic rhinitis: Secondary | ICD-10-CM | POA: Diagnosis not present

## 2015-04-24 DIAGNOSIS — E785 Hyperlipidemia, unspecified: Secondary | ICD-10-CM

## 2015-04-24 DIAGNOSIS — L02224 Furuncle of groin: Secondary | ICD-10-CM | POA: Diagnosis not present

## 2015-04-24 DIAGNOSIS — K5909 Other constipation: Secondary | ICD-10-CM

## 2015-04-24 DIAGNOSIS — K5900 Constipation, unspecified: Secondary | ICD-10-CM

## 2015-04-24 NOTE — Progress Notes (Signed)
Patient ID: Tyler Beard, male   DOB: 12-Jun-1934, 81 y.o.   MRN: [id number]       Facility: [facility]  No Known Allergies  Chief complaint- medical management of chronic issues  Code status: Full Code  HPI 81 y/o male patient is seen for routine visit. Staff have noticed a new blister to his right groin area. He complaints of pain in this area. He has been at his baseline otherwise. No fever reported. No acute behavior changes per nursing. No fall reported.    Review of Systems   Constitutional: Negative for fever   HENT: Negative for congestion   Respiratory: Negative for cough, shortness of breath and wheezing.    Cardiovascular: Negative for chest pain, palpitations Gastrointestinal: Negative for heartburn, nausea, vomiting, abdominal pain.  Genitourinary: Negative for dysuria.   Musculoskeletal: Negative for back pain  Past Medical History  Diagnosis Date  . Hypertension   . Depression   . GERD (gastroesophageal reflux disease)   . Hypercholesterolemia   . Glaucoma   . Cataracts, bilateral   . Cerebrovascular accident [facility]) 2007       Medication List       This list is accurate as of: 04/24/15  2:44 PM.  Always use your most recent med list.               atorvastatin 10 MG tablet  Commonly known as:  LIPITOR  Take one tablet by mouth once daily for cholesterol/CVA     baclofen 10 MG tablet  Commonly known as:  LIORESAL  Take 10 mg by mouth 2 (two) times daily. For Spastic Hemiparesis     calcium carbonate 1250 (500 Ca) MG tablet  Commonly known as:  OS-CAL - dosed in mg of elemental calcium  Take 1 tablet by mouth daily.     ferrous sulfate 325 (65 FE) MG tablet  Take one tablet by mouth twice daily for anemia     lactulose 10 GM/15ML solution  Commonly known as:  CHRONULAC  Take 45ml by mouth twice daily for constipation     Linaclotide 145 MCG Caps capsule  Commonly known as:  LINZESS  Take one capsule  by mouth 30 minutes prior to fist meal on empty stomach once daily for constipation     loratadine 10 MG tablet  Commonly known as:  CLARITIN  Take 10 mg by mouth daily.     Melatonin 3 MG Tabs  Take 3 mg by mouth daily.     pantoprazole 40 MG tablet  Commonly known as:  PROTONIX  Take 40 mg by mouth daily. For GERD     sennosides-docusate sodium 8.6-50 MG tablet  Commonly known as:  SENOKOT-S  Take 1 tablet by mouth 2 (two) times daily. For constipation     sertraline 25 MG tablet  Commonly known as:  ZOLOFT  Take one tablet by mouth every morning for depression     sucralfate 1 g tablet  Commonly known as:  CARAFATE  Take one tablet by mouth twice daily for reflux     SYSTANE BALANCE OP  Apply 1 drop to eye 3 (three) times daily. Both eyes     vitamin B-12 500 MCG tablet  Commonly known as:  CYANOCOBALAMIN  Take 500 mcg by mouth daily.     Vitamin D 2000 units Caps  Take 4,000 Units by mouth daily. For Vitamin D Deficiency        Physical  exam BP 126/70 mmHg  Pulse 69  Temp(Src) 96.9 F (36.1 C) (Oral)  Resp 20  Ht 5\' 8"  (1.727 m)  Wt 207 lb 9.6 oz (94.167 kg)  BMI 31.57 kg/m2  SpO2 97%   Wt Readings from Last 3 Encounters:  04/24/15 207 lb 9.6 oz (94.167 kg)  11/12/14 198 lb 12.8 oz (90.175 kg)  10/09/14 201 lb 12.8 oz (91.536 kg)   General- elderly obese chronically ill appearing male in no acute distress Head- atraumatic, normocephalic Eyes- no pallor, no icterus, no discharge Neck- no lymphadenopathy Cardiovascular- normal s1,s2, no murmur, no leg edema Respiratory- bilateral clear to auscultation, no wheeze, no rhonchi, no crackles Abdomen- bowel sounds present, soft, non tender, scar of old peg tube Musculoskeletal- left sided weakness with hemiparesis, LUE contracture at elbow and wrist joint, splint in place, spasticity in left leg and arm, RUE and RLE adequate strength, can self propel on WC Neuro- left sided hemiparesis, has dysarthria Skin-  warm and dry, erythema with raised furuncle to right groin area, no drainage Psychiatry- alert and oriented to person  Labs   CBC Latest Ref Rng 12/17/2014 09/11/2014 09/02/2014  WBC - 6.8 6.5 7.7  Hemoglobin 13.5 - 17.5 g/dL 12.9(A) 11.1(A) 10.3(A)  Hematocrit 41 - 53 % 42 35(A) 33(A)  Platelets 150 - 399 K/L 235 232 224   CMP Latest Ref Rng 12/17/2014 07/17/2014 11/26/2013  Glucose 70 - 99 mg/dL - - -  BUN 4 - 21 mg/dL 8 10 10   Creatinine 0.6 - 1.3 mg/dL 1.0 0.9 0.9  Sodium 137 - 147 mmol/L 142 140 139  Potassium 3.4 - 5.3 mmol/L 4.2 4.1 4.1  Chloride 96 - 112 mEq/L - - -  CO2 19 - 32 mEq/L - - -  Calcium 8.4 - 10.5 mg/dL - - -  Total Protein 6.0 - 8.3 g/dL - - -  Total Bilirubin 0.3 - 1.2 mg/dL - - -  Alkaline Phos 25 - 125 U/L 68 77 -  AST 14 - 40 U/L 10(A) 10(A) 9(A)  ALT 10 - 40 U/L 8(A) 8(A) 9(A)   Lipid Panel     Component Value Date/Time   CHOL 126 03/21/2015   TRIG 120 03/21/2015   HDL 29* 03/21/2015   LDLCALC 73 03/21/2015   Lab Results  Component Value Date   HGBA1C 6.3 04/09/2015    Assessment/plan  Furuncle Start doxycycline 100 mg bid x 5 days with florastor 250 mg bid x 1 week and monitor  Hyperlipidemia LDL at goal. Continue lipitor 10 mg daily  Allergic rhinitis Improved with claritin, continue claritin for now  Constipation Stable, continue senokot s and lactulose for now   Blanchie Serve, MD  [facility] [phone] (Monday-Friday 8 am - 5 pm) [phone] (afterhours)

## 2015-05-02 LAB — CBC AND DIFFERENTIAL
HEMATOCRIT: 38 % — AB (ref 41–53)
Hemoglobin: 11.9 g/dL — AB (ref 13.5–17.5)
PLATELETS: 242 10*3/uL (ref 150–399)
WBC: 7.9 10*3/mL

## 2015-05-05 LAB — MICROALBUMIN, URINE: Microalb, Ur: 5.2

## 2015-05-26 ENCOUNTER — Non-Acute Institutional Stay (SKILLED_NURSING_FACILITY): Payer: Medicare Other | Admitting: Internal Medicine

## 2015-05-26 ENCOUNTER — Encounter: Payer: Self-pay | Admitting: Internal Medicine

## 2015-05-26 DIAGNOSIS — K449 Diaphragmatic hernia without obstruction or gangrene: Secondary | ICD-10-CM

## 2015-05-26 DIAGNOSIS — K5909 Other constipation: Secondary | ICD-10-CM

## 2015-05-26 DIAGNOSIS — K219 Gastro-esophageal reflux disease without esophagitis: Secondary | ICD-10-CM

## 2015-05-26 DIAGNOSIS — H04123 Dry eye syndrome of bilateral lacrimal glands: Secondary | ICD-10-CM | POA: Diagnosis not present

## 2015-05-26 DIAGNOSIS — K5900 Constipation, unspecified: Secondary | ICD-10-CM

## 2015-05-26 NOTE — Addendum Note (Signed)
Addended byBlanchie Serve on: 05/26/2015 05:45 PM   Modules accepted: Level of Service

## 2015-05-26 NOTE — Progress Notes (Addendum)
Patient ID: Tyler Beard, male   DOB: [birth year], 81 y.o.   MRN: [id number]       Facility: [facility]  No Known Allergies  Chief complaint- medical management of chronic issues  Code status: Full Code  HPI 81 y/o male patient is seen for routine visit. He has been at his baseline. No new concerns   Review of Systems   Constitutional: Negative for fever   HENT: Negative for congestion   Respiratory: Negative for cough, shortness of breath and wheezing.    Cardiovascular: Negative for chest pain, palpitations Gastrointestinal: Negative for heartburn, nausea, vomiting, abdominal pain.  Genitourinary: Negative for dysuria.   Musculoskeletal: Negative for back pain  Past Medical History  Diagnosis Date  . Hypertension   . Depression   . GERD (gastroesophageal reflux disease)   . Hypercholesterolemia   . Glaucoma   . Cataracts, bilateral   . Cerebrovascular accident [facility]) 2007       Medication List       This list is accurate as of: 05/26/15  4:16 PM.  Always use your most recent med list.               atorvastatin 10 MG tablet  Commonly known as:  LIPITOR  Take one tablet by mouth once daily for cholesterol/CVA     baclofen 10 MG tablet  Commonly known as:  LIORESAL  Take 10 mg by mouth 2 (two) times daily. For Spastic Hemiparesis     calcium carbonate 1250 (500 Ca) MG tablet  Commonly known as:  OS-CAL - dosed in mg of elemental calcium  Take 1 tablet by mouth daily.     ferrous sulfate 325 (65 FE) MG tablet  Take one tablet by mouth twice daily for anemia     lactulose 10 GM/15ML solution  Commonly known as:  CHRONULAC  Take 78ml by mouth twice daily for constipation     loratadine 10 MG tablet  Commonly known as:  CLARITIN  Take 10 mg by mouth daily.     Melatonin 3 MG Tabs  Take 3 mg by mouth daily.     pantoprazole 40 MG tablet  Commonly known as:  PROTONIX  Take 40 mg by mouth daily. For GERD     sennosides-docusate sodium 8.6-50 MG tablet  Commonly known as:  SENOKOT-S  Take 1 tablet by mouth 2 (two) times daily. For constipation     sertraline 25 MG tablet  Commonly known as:  ZOLOFT  Take one tablet by mouth every morning for depression     sucralfate 1 g tablet  Commonly known as:  CARAFATE  Take one tablet by mouth twice daily for reflux     SYSTANE BALANCE OP  Apply 1 drop to eye 3 (three) times daily. Both eyes     vitamin B-12 500 MCG tablet  Commonly known as:  CYANOCOBALAMIN  Take 500 mcg by mouth daily.     Vitamin D 2000 units Caps  Take 4,000 Units by mouth daily. For Vitamin D Deficiency        Physical exam BP 136/81 mmHg  Pulse 77  Temp(Src) 98.3 F (36.8 C) (Oral)  Resp 18  Ht 5\' 8"  (1.727 m)  Wt 190 lb 11.2 oz (86.501 kg)  BMI 29.00 kg/m2  SpO2 99%   Wt Readings from Last 3 Encounters:  05/26/15 190 lb 11.2 oz (86.501 kg)  04/24/15 207 lb 9.6 oz (94.167 kg)  11/12/14  198 lb 12.8 oz (90.175 kg)   General- elderly obese chronically ill appearing male in no acute distress Head- atraumatic, normocephalic Eyes- no pallor, no icterus, no discharge Neck- no lymphadenopathy Cardiovascular- normal s1,s2, no murmur, no leg edema Respiratory- bilateral clear to auscultation, no wheeze, no rhonchi, no crackles Abdomen- bowel sounds present, soft, non tender, scar of old peg tube Musculoskeletal- left sided weakness with hemiparesis, LUE contracture at elbow and wrist joint, splint in place, spasticity in left leg and arm, RUE and RLE adequate strength, can self propel on WC Neuro- left sided hemiparesis, has dysarthria Skin- warm and dry Psychiatry- alert and oriented to person  Labs   CBC Latest Ref Rng 05/02/2015 12/17/2014 09/11/2014  WBC - 7.9 6.8 6.5  Hemoglobin 13.5 - 17.5 g/dL 11.9(A) 12.9(A) 11.1(A)  Hematocrit 41 - 53 % 38(A) 42 35(A)  Platelets 150 - 399 K/L 242 235 232   CMP Latest Ref Rng 12/17/2014 07/17/2014 11/26/2013  Glucose 70 -  99 mg/dL - - -  BUN 4 - 21 mg/dL 8 10 10   Creatinine 0.6 - 1.3 mg/dL 1.0 0.9 0.9  Sodium 137 - 147 mmol/L 142 140 139  Potassium 3.4 - 5.3 mmol/L 4.2 4.1 4.1  Chloride 96 - 112 mEq/L - - -  CO2 19 - 32 mEq/L - - -  Calcium 8.4 - 10.5 mg/dL - - -  Total Protein 6.0 - 8.3 g/dL - - -  Total Bilirubin 0.3 - 1.2 mg/dL - - -  Alkaline Phos 25 - 125 U/L 68 77 -  AST 14 - 40 U/L 10(A) 10(A) 9(A)  ALT 10 - 40 U/L 8(A) 8(A) 9(A)   Lipid Panel     Component Value Date/Time   CHOL 126 03/21/2015   TRIG 120 03/21/2015   HDL 29* 03/21/2015   LDLCALC 73 03/21/2015   Lab Results  Component Value Date   HGBA1C 6.3 04/09/2015    Assessment/plan  gerd Stable with carafate and protonix, continue this and monitor  Constipation Chronic, continue lactulose and senokot s, off linzess.   Dry eyes Continue systane eye drops   Nyonna Hargrove, MD  [facility] [phone] (Monday-Friday 8 am - 5 pm) [phone] (afterhours)      Please note a change in billing for this encounter.

## 2015-06-14 LAB — BASIC METABOLIC PANEL
BUN: 9 mg/dL (ref 4–21)
CREATININE: 0.8 mg/dL (ref 0.6–1.3)
Glucose: 140 mg/dL
POTASSIUM: 4.4 mmol/L (ref 3.4–5.3)
Sodium: 138 mmol/L (ref 137–147)

## 2015-06-14 LAB — HEPATIC FUNCTION PANEL
ALK PHOS: 78 U/L (ref 25–125)
ALT: 12 U/L (ref 10–40)
AST: 14 U/L (ref 14–40)
Bilirubin, Total: 0.5 mg/dL

## 2015-06-14 LAB — CBC AND DIFFERENTIAL
HCT: 41 % (ref 41–53)
Hemoglobin: 13.7 g/dL (ref 13.5–17.5)
PLATELETS: 241 10*3/uL (ref 150–399)
WBC: 8.5 10^3/mL

## 2015-06-16 ENCOUNTER — Encounter: Payer: Self-pay | Admitting: Internal Medicine

## 2015-06-16 ENCOUNTER — Non-Acute Institutional Stay (SKILLED_NURSING_FACILITY): Payer: Medicare Other | Admitting: Internal Medicine

## 2015-06-16 DIAGNOSIS — K269 Duodenal ulcer, unspecified as acute or chronic, without hemorrhage or perforation: Secondary | ICD-10-CM

## 2015-06-16 DIAGNOSIS — K5900 Constipation, unspecified: Secondary | ICD-10-CM

## 2015-06-16 DIAGNOSIS — D372 Neoplasm of uncertain behavior of small intestine: Secondary | ICD-10-CM

## 2015-06-16 DIAGNOSIS — K567 Ileus, unspecified: Secondary | ICD-10-CM

## 2015-06-16 LAB — CBC AND DIFFERENTIAL
HCT: 41 % (ref 41–53)
Hemoglobin: 12.7 g/dL — AB (ref 13.5–17.5)
PLATELETS: 248 10*3/uL (ref 150–399)
WBC: 7.9 10^3/mL

## 2015-06-16 LAB — BASIC METABOLIC PANEL
BUN: 9 mg/dL (ref 4–21)
CREATININE: 0.8 mg/dL (ref 0.6–1.3)
Glucose: 144 mg/dL
POTASSIUM: 4 mmol/L (ref 3.4–5.3)
SODIUM: 138 mmol/L (ref 137–147)

## 2015-06-16 LAB — LIPID PANEL
Cholesterol: 130 mg/dL (ref 0–200)
HDL: 29 mg/dL — AB (ref 35–70)
LDL Cholesterol: 75 mg/dL
Triglycerides: 134 mg/dL (ref 40–160)

## 2015-06-16 LAB — HEPATIC FUNCTION PANEL
ALK PHOS: 78 U/L (ref 25–125)
ALT: 10 U/L (ref 10–40)
AST: 12 U/L — AB (ref 14–40)
BILIRUBIN, TOTAL: 0.1 mg/dL

## 2015-06-16 NOTE — Progress Notes (Signed)
Patient ID: Tyler Beard, male   DOB: 26-Apr-1934, 81 y.o.   MRN: [id number]       Facility: [facility]  Chief Complaint  Patient presents with  . Medical Management of Chronic Issues    Routine Visit   No Known Allergies  Code status: Full Code  HPI 81 y/o male patient is seen for routine visit. He had vomit x 1 on Friday night with some dark material noted per patient. No further vomiting after that. Denies nausea, vomiting or abdominal pian today. He had blood work post vomiting and was started on iv fluids. Abdominal xray showed ileus. He is currently on liquid diet. He has been at his baseline otherwise. No new concerns. He has history of duodenal ulcer and villous adenoma of duodenum.    Review of Systems   Constitutional: Negative for fever   HENT: Negative for congestion   Respiratory: Negative for cough, shortness of breath and wheezing.    Cardiovascular: Negative for chest pain, palpitations Gastrointestinal: Negative for heartburn, nausea, vomiting, abdominal pain.  Genitourinary: Negative for dysuria.   Musculoskeletal: Negative for back pain  Past Medical History  Diagnosis Date  . Hypertension   . Depression   . GERD (gastroesophageal reflux disease)   . Hypercholesterolemia   . Glaucoma   . Cataracts, bilateral   . Cerebrovascular accident [facility]) 2007       Medication List       This list is accurate as of: 06/16/15  2:28 PM.  Always use your most recent med list.               atorvastatin 10 MG tablet  Commonly known as:  LIPITOR  Take one tablet by mouth once daily for cholesterol/CVA     baclofen 10 MG tablet  Commonly known as:  LIORESAL  Take 10 mg by mouth 2 (two) times daily. For Spastic Hemiparesis     calcium carbonate 1250 (500 Ca) MG tablet  Commonly known as:  OS-CAL - dosed in mg of elemental calcium  Take 1 tablet by mouth daily.     ferrous sulfate 325 (65 FE) MG tablet  Take one tablet  by mouth twice daily for anemia     lactulose 10 GM/15ML solution  Commonly known as:  CHRONULAC  Take 38ml by mouth twice daily for constipation     loratadine 10 MG tablet  Commonly known as:  CLARITIN  Take 10 mg by mouth daily.     Melatonin 5 MG Tabs  Take 5 mg by mouth at bedtime.     pantoprazole 40 MG tablet  Commonly known as:  PROTONIX  Take 40 mg by mouth daily. For GERD     sennosides-docusate sodium 8.6-50 MG tablet  Commonly known as:  SENOKOT-S  Take 1 tablet by mouth 2 (two) times daily. For constipation     sertraline 25 MG tablet  Commonly known as:  ZOLOFT  Take one tablet by mouth every morning for depression     sucralfate 1 g tablet  Commonly known as:  CARAFATE  Take one tablet by mouth twice daily for reflux     SYSTANE BALANCE OP  Apply 1 drop to eye 3 (three) times daily. Both eyes     vitamin B-12 500 MCG tablet  Commonly known as:  CYANOCOBALAMIN  Take 500 mcg by mouth daily.     Vitamin D 2000 units Caps  Take 4,000 Units by mouth daily.  For Vitamin D Deficiency        Physical exam BP 103/72 mmHg  Pulse 77  Temp(Src) 97.5 F (36.4 C) (Oral)  Resp 19  Ht 5\' 8"  (1.727 m)  Wt 190 lb 11.2 oz (86.501 kg)  BMI 29.00 kg/m2  SpO2 98%   Wt Readings from Last 3 Encounters:  06/16/15 190 lb 11.2 oz (86.501 kg)  05/26/15 190 lb 11.2 oz (86.501 kg)  04/24/15 207 lb 9.6 oz (94.167 kg)   General- elderly obese chronically ill appearing male in no acute distress Head- atraumatic, normocephalic Eyes- no pallor, no icterus, no discharge Neck- no lymphadenopathy Cardiovascular- normal s1,s2, no murmur, no leg edema Respiratory- bilateral clear to auscultation, no wheeze, no rhonchi, no crackles Abdomen- bowel sounds present, soft, non tender, scar of old peg tube Musculoskeletal- left sided weakness with hemiparesis, LUE contracture at elbow and wrist joint, splint in place, spasticity in left leg and arm, RUE and RLE adequate strength, can  self propel on WC Neuro- left sided hemiparesis, has dysarthria Skin- warm and dry Psychiatry- alert and oriented to person  Labs   CBC Latest Ref Rng 06/14/2015 05/02/2015 12/17/2014  WBC - 8.5 7.9 6.8  Hemoglobin 13.5 - 17.5 g/dL 13.7 11.9(A) 12.9(A)  Hematocrit 41 - 53 % 41 38(A) 42  Platelets 150 - 399 K/L 241 242 235   CMP Latest Ref Rng 06/14/2015 12/17/2014 07/17/2014  Glucose 70 - 99 mg/dL - - -  BUN 4 - 21 mg/dL 9 8 10   Creatinine 0.6 - 1.3 mg/dL 0.8 1.0 0.9  Sodium 137 - 147 mmol/L 138 142 140  Potassium 3.4 - 5.3 mmol/L 4.4 4.2 4.1  Chloride 96 - 112 mEq/L - - -  CO2 19 - 32 mEq/L - - -  Calcium 8.4 - 10.5 mg/dL - - -  Total Protein 6.0 - 8.3 g/dL - - -  Total Bilirubin 0.3 - 1.2 mg/dL - - -  Alkaline Phos 25 - 125 U/L 78 68 77  AST 14 - 40 U/L 14 10(A) 10(A)  ALT 10 - 40 U/L 12 8(A) 8(A)   Lipid Panel     Component Value Date/Time   CHOL 126 03/21/2015   TRIG 120 03/21/2015   HDL 29* 03/21/2015   LDLCALC 73 03/21/2015   Lab Results  Component Value Date   HGBA1C 6.3 04/09/2015    KUB X-Ray Kidney, Ureter, Bladder 06/14/15 Impression. Generalized ileus  Assessment/plan  Ileus With vomiting on Friday. Had bowel rest and iv fluids. Had bowel movement on Saturday. Currently denies any abdominal pain, nausea or vomiting and abdominal exam has no acute finding. Will do liquid diet for lunch and advance diet as tolerated. Reviewed labs.  Duodenal ulcer Currently on carafate and protonix, continue this and monitor for further hematemesis or abdominal pain  Villous adenoma of duodenum With recent ileus and his history of villous adenoma of duodenum, monitor clinically for signs of bowel obstruction. Reviewed gi note. He is not a candidate for whipple's procedure. Will get GI to evaluate for need of duodenal stenting.   Constipation Chronic, continue lactulose and senokot s  Reviewed care plan with patient and his wife.   Blanchie Serve, MD  [facility] [phone] (Monday-Friday 8 am - 5 pm) [phone] (afterhours)      Please note a change in billing for this encounter.

## 2015-07-16 ENCOUNTER — Telehealth: Payer: Self-pay | Admitting: Gastroenterology

## 2015-07-16 NOTE — Telephone Encounter (Signed)
Received [facility] records and placed on Dr. Woodward Ku desk for review. Former Dr. Deatra Ina patient, patient is not happy at [place].

## 2015-07-21 ENCOUNTER — Non-Acute Institutional Stay (SKILLED_NURSING_FACILITY): Payer: Medicare Other | Admitting: Internal Medicine

## 2015-07-21 ENCOUNTER — Encounter: Payer: Self-pay | Admitting: Internal Medicine

## 2015-07-21 DIAGNOSIS — D509 Iron deficiency anemia, unspecified: Secondary | ICD-10-CM

## 2015-07-21 DIAGNOSIS — K589 Irritable bowel syndrome without diarrhea: Secondary | ICD-10-CM | POA: Diagnosis not present

## 2015-07-21 DIAGNOSIS — E559 Vitamin D deficiency, unspecified: Secondary | ICD-10-CM

## 2015-07-21 NOTE — Progress Notes (Signed)
Patient ID: Tyler Beard, male   DOB: February 08, 1935, 81 y.o.   MRN: [id number]       Facility: [facility]  Chief Complaint  Patient presents with  . Medical Management of Chronic Issues    Routine Visit   No Known Allergies  Code status: Full Code  HPI 81 y/o male patient is seen for routine visit. Denies any concern today. No new concern from nursing staff. Has been at his baseline. He feeds himself. He needs maximum assist with ADLs given his left sided weakness post CVA, no fall reported. No new skin concern.    Review of Systems   Constitutional: Negative for fever   HENT: Negative for congestion   Respiratory: Negative for cough, shortness of breath and wheezing.    Cardiovascular: Negative for chest pain, palpitations Gastrointestinal: Negative for heartburn, nausea, vomiting, abdominal pain.  Genitourinary: Negative for dysuria.   Musculoskeletal: Negative for back pain. Positive for occasional left shoulder pain   Past Medical History  Diagnosis Date  . Hypertension   . Depression   . GERD (gastroesophageal reflux disease)   . Hypercholesterolemia   . Glaucoma   . Cataracts, bilateral   . Cerebrovascular accident [facility]) 2007       Medication List       This list is accurate as of: 07/21/15  2:34 PM.  Always use your most recent med list.               atorvastatin 10 MG tablet  Commonly known as:  LIPITOR  Take one tablet by mouth once daily for cholesterol/CVA     baclofen 10 MG tablet  Commonly known as:  LIORESAL  Take 10 mg by mouth 2 (two) times daily. For Spastic Hemiparesis     calcium carbonate 1250 (500 Ca) MG tablet  Commonly known as:  OS-CAL - dosed in mg of elemental calcium  Take 1 tablet by mouth daily.     ferrous sulfate 325 (65 FE) MG tablet  Take one tablet by mouth twice daily for anemia     lactulose 10 GM/15ML solution  Commonly known as:  CHRONULAC  Take 73ml by mouth twice daily for  constipation     linaclotide 145 MCG Caps capsule  Commonly known as:  LINZESS  Take 145 mcg by mouth daily before breakfast.     loratadine 10 MG tablet  Commonly known as:  CLARITIN  Take 10 mg by mouth daily.     Melatonin 5 MG Tabs  Take 5 mg by mouth at bedtime.     pantoprazole 40 MG tablet  Commonly known as:  PROTONIX  Take 40 mg by mouth daily. For GERD     polyethylene glycol packet  Commonly known as:  MIRALAX / GLYCOLAX  Take 17 g by mouth daily.     sennosides-docusate sodium 8.6-50 MG tablet  Commonly known as:  SENOKOT-S  Take 1 tablet by mouth 2 (two) times daily. For constipation     sertraline 25 MG tablet  Commonly known as:  ZOLOFT  Take one tablet by mouth every morning for depression     sucralfate 1 g tablet  Commonly known as:  CARAFATE  Take one tablet by mouth twice daily for reflux     SYSTANE BALANCE OP  Apply 1 drop to eye 3 (three) times daily. Both eyes     vitamin B-12 500 MCG tablet  Commonly known as:  CYANOCOBALAMIN  Take  500 mcg by mouth daily.     Vitamin D 2000 units Caps  Take 4,000 Units by mouth daily. For Vitamin D Deficiency        Physical exam BP 122/66 mmHg  Pulse 75  Temp(Src) 98.1 F (36.7 C) (Oral)  Ht 5\' 8"  (1.727 m)  Wt 208 lb 6.4 oz (94.53 kg)  BMI 31.69 kg/m2  SpO2 97%   Wt Readings from Last 3 Encounters:  07/21/15 208 lb 6.4 oz (94.53 kg)  06/16/15 190 lb 11.2 oz (86.501 kg)  05/26/15 190 lb 11.2 oz (86.501 kg)   General- elderly obese chronically ill appearing male in no acute distress Head- atraumatic, normocephalic Eyes- no pallor, no icterus, no discharge Neck- no lymphadenopathy Cardiovascular- normal s1,s2, no murmur, no leg edema Respiratory- bilateral clear to auscultation, no wheeze, no rhonchi, no crackles Abdomen- bowel sounds present, soft, non tender, scar of old peg tube Musculoskeletal- left sided weakness with hemiparesis, LUE contracture at elbow and wrist joint, splint in  place, spasticity in left leg and arm, RUE and RLE adequate strength, can self propel on WC Neuro- left sided hemiparesis, has dysarthria Skin- warm and dry Psychiatry- alert and oriented to person  Labs   CBC Latest Ref Rng 06/14/2015 05/02/2015 12/17/2014  WBC - 8.5 7.9 6.8  Hemoglobin 13.5 - 17.5 g/dL 13.7 11.9(A) 12.9(A)  Hematocrit 41 - 53 % 41 38(A) 42  Platelets 150 - 399 K/L 241 242 235   CMP Latest Ref Rng 06/14/2015 12/17/2014 07/17/2014  Glucose 70 - 99 mg/dL - - -  BUN 4 - 21 mg/dL 9 8 10   Creatinine 0.6 - 1.3 mg/dL 0.8 1.0 0.9  Sodium 137 - 147 mmol/L 138 142 140  Potassium 3.4 - 5.3 mmol/L 4.4 4.2 4.1  Chloride 96 - 112 mEq/L - - -  CO2 19 - 32 mEq/L - - -  Calcium 8.4 - 10.5 mg/dL - - -  Total Protein 6.0 - 8.3 g/dL - - -  Total Bilirubin 0.3 - 1.2 mg/dL - - -  Alkaline Phos 25 - 125 U/L 78 68 77  AST 14 - 40 U/L 14 10(A) 10(A)  ALT 10 - 40 U/L 12 8(A) 8(A)   Lipid Panel     Component Value Date/Time   CHOL 126 03/21/2015   TRIG 120 03/21/2015   HDL 29* 03/21/2015   LDLCALC 73 03/21/2015   Lab Results  Component Value Date   HGBA1C 6.3 04/09/2015    KUB X-Ray Kidney, Ureter, Bladder 06/14/15 Impression. Generalized ileus  Assessment/plan  IBS Continue linzess daily and miralsax qod and monitor  vitamin d def Continue vitamin d supplement with calcium  Iron def anemia Continue ferrous sulfate, monitor cbc. With hx of duodenal ulcer, monitor for signs of bleed   Blanchie Serve, MD  [facility] (Monday-Friday 8 am - 5 pm) [phone] (afterhours)      Please note a change in billing for this encounter.

## 2015-07-24 NOTE — Telephone Encounter (Signed)
Dr. Silverio Decamp reviewed records and has declined to accept patient. Left message for patient to return my call.

## 2015-08-25 ENCOUNTER — Non-Acute Institutional Stay (SKILLED_NURSING_FACILITY): Payer: Medicare Other | Admitting: Internal Medicine

## 2015-08-25 ENCOUNTER — Encounter: Payer: Self-pay | Admitting: Internal Medicine

## 2015-08-25 DIAGNOSIS — E785 Hyperlipidemia, unspecified: Secondary | ICD-10-CM

## 2015-08-25 DIAGNOSIS — I6930 Unspecified sequelae of cerebral infarction: Secondary | ICD-10-CM

## 2015-08-25 DIAGNOSIS — G4700 Insomnia, unspecified: Secondary | ICD-10-CM

## 2015-08-25 DIAGNOSIS — K449 Diaphragmatic hernia without obstruction or gangrene: Secondary | ICD-10-CM | POA: Diagnosis not present

## 2015-08-25 NOTE — Progress Notes (Signed)
Patient ID: Tyler Beard, male   DOB: 05-Aug-1934, 81 y.o.   MRN: [id number]       Facility: [facility]  Chief Complaint  Patient presents with  . Medical Management of Chronic Issues    Routine Visit   No Known Allergies  Code status: Full Code  HPI 81 y/o male patient is seen for routine visit. He complaints of trouble falling asleep at night. No other concerns. .    Review of Systems   Constitutional: Negative for fever   HENT: Negative for congestion   Respiratory: Negative for cough, shortness of breath Cardiovascular: Negative for chest pain, palpitations Gastrointestinal: Negative for heartburn, nausea, vomiting, abdominal pain. Had bowel movement this am Genitourinary: Negative for dysuria.   Musculoskeletal: Negative for back pain and fall    Past Medical History  Diagnosis Date  . Hypertension   . Depression   . GERD (gastroesophageal reflux disease)   . Hypercholesterolemia   . Glaucoma   . Cataracts, bilateral   . Cerebrovascular accident [facility]) 2007       Medication List       This list is accurate as of: 08/25/15  2:49 PM.  Always use your most recent med list.               atorvastatin 10 MG tablet  Commonly known as:  LIPITOR  Take one tablet by mouth once daily for cholesterol/CVA     baclofen 10 MG tablet  Commonly known as:  LIORESAL  Take 10 mg by mouth 2 (two) times daily. For Spastic Hemiparesis     calcium carbonate 1250 (500 Ca) MG tablet  Commonly known as:  OS-CAL - dosed in mg of elemental calcium  Take 1 tablet by mouth daily.     ferrous sulfate 325 (65 FE) MG tablet  Take one tablet by mouth twice daily for anemia     lactulose 10 GM/15ML solution  Commonly known as:  CHRONULAC  Take 36ml by mouth twice daily for constipation     linaclotide 145 MCG Caps capsule  Commonly known as:  LINZESS  Take 145 mcg by mouth daily before breakfast.     loratadine 10 MG tablet  Commonly  known as:  CLARITIN  Take 10 mg by mouth daily.     Melatonin 5 MG Tabs  Take 5 mg by mouth at bedtime.     pantoprazole 40 MG tablet  Commonly known as:  PROTONIX  Take 40 mg by mouth daily. For GERD     polyethylene glycol packet  Commonly known as:  MIRALAX / GLYCOLAX  Take 17 g by mouth daily.     sennosides-docusate sodium 8.6-50 MG tablet  Commonly known as:  SENOKOT-S  Take 1 tablet by mouth 2 (two) times daily. For constipation     sertraline 25 MG tablet  Commonly known as:  ZOLOFT  Take one tablet by mouth every morning for depression     sucralfate 1 g tablet  Commonly known as:  CARAFATE  Take one tablet by mouth twice daily for reflux     SYSTANE BALANCE OP  Apply 1 drop to eye 3 (three) times daily. Both eyes     vitamin B-12 500 MCG tablet  Commonly known as:  CYANOCOBALAMIN  Take 500 mcg by mouth daily.     Vitamin D 2000 units Caps  Take 4,000 Units by mouth daily. For Vitamin D Deficiency  Physical exam BP 122/74 mmHg  Pulse 74  Temp(Src) 97.6 F (36.4 C) (Oral)  Resp 16  Ht 5\' 8"  (1.727 m)  Wt 206 lb 12.8 oz (93.804 kg)  BMI 31.45 kg/m2   Wt Readings from Last 3 Encounters:  08/25/15 206 lb 12.8 oz (93.804 kg)  07/21/15 208 lb 6.4 oz (94.53 kg)  06/16/15 190 lb 11.2 oz (86.501 kg)   General- elderly obese chronically ill appearing male in no acute distress Head- atraumatic, normocephalic Eyes- no pallor, no discharge Neck- no lymphadenopathy Cardiovascular- normal s1,s2, no murmur, no leg edema Respiratory- bilateral clear to auscultation, no wheeze, no rhonchi, no crackles Abdomen- bowel sounds present, soft, non tender, scar of old peg tube Musculoskeletal- left sided weakness with hemiparesis, LUE contracture at elbow and wrist joint, splint in place, spasticity in left leg and arm, RUE and RLE adequate strength, can self propel on WC Neuro- left sided hemiparesis, has dysarthria Skin- warm and dry Psychiatry- alert and  oriented to person  Labs   CBC Latest Ref Rng 06/16/2015 06/14/2015 05/02/2015  WBC - 7.9 8.5 7.9  Hemoglobin 13.5 - 17.5 g/dL 12.7(A) 13.7 11.9(A)  Hematocrit 41 - 53 % 41 41 38(A)  Platelets 150 - 399 K/L 248 241 242   CMP Latest Ref Rng 06/16/2015 06/14/2015 12/17/2014  Glucose 70 - 99 mg/dL - - -  BUN 4 - 21 mg/dL 9 9 8   Creatinine 0.6 - 1.3 mg/dL 0.8 0.8 1.0  Sodium 137 - 147 mmol/L 138 138 142  Potassium 3.4 - 5.3 mmol/L 4.0 4.4 4.2  Chloride 96 - 112 mEq/L - - -  CO2 19 - 32 mEq/L - - -  Calcium 8.4 - 10.5 mg/dL - - -  Total Protein 6.0 - 8.3 g/dL - - -  Total Bilirubin 0.3 - 1.2 mg/dL - - -  Alkaline Phos 25 - 125 U/L 78 78 68  AST 14 - 40 U/L 12(A) 14 10(A)  ALT 10 - 40 U/L 10 12 8(A)   Lipid Panel     Component Value Date/Time   CHOL 130 06/16/2015   TRIG 134 06/16/2015   HDL 29* 06/16/2015   LDLCALC 75 06/16/2015   Lab Results  Component Value Date   HGBA1C 6.3 04/09/2015    KUB X-Ray Kidney, Ureter, Bladder 06/14/15 Impression. Generalized ileus  Assessment/plan  gerd Stable, continue protonix with sucralfate and monitor  HLD Continue atorvastatin, monitor  Insomnia Currently on melatonin 5 mg qhs. Start trazodone 25 mg po qhs. sleep hygiene discussed. monitor  History of CVA With residual weakness. Continue statin and baclofen    Blanchie Serve, MD  [facility] [phone] (Monday-Friday 8 am - 5 pm) [phone] (afterhours)

## 2015-09-22 ENCOUNTER — Non-Acute Institutional Stay (SKILLED_NURSING_FACILITY): Payer: Medicare Other | Admitting: Internal Medicine

## 2015-09-22 ENCOUNTER — Encounter: Payer: Self-pay | Admitting: Internal Medicine

## 2015-09-22 DIAGNOSIS — D509 Iron deficiency anemia, unspecified: Secondary | ICD-10-CM | POA: Diagnosis not present

## 2015-09-22 DIAGNOSIS — G4700 Insomnia, unspecified: Secondary | ICD-10-CM

## 2015-09-22 DIAGNOSIS — R1312 Dysphagia, oropharyngeal phase: Secondary | ICD-10-CM | POA: Diagnosis not present

## 2015-09-22 NOTE — Progress Notes (Signed)
Patient ID: Tyler Beard, male   DOB: 18-Feb-1934, 81 y.o.   MRN: [id number]       Facility: [facility]  Chief Complaint  Patient presents with  . Medical Management of Chronic Issues    Routine Visit   No Known Allergies  Advanced Directives 12/17/2014  Does patient have an advance directive? Yes  Type of Advance Directive Out of facility DNR (pink MOST or yellow form)  Does patient want to make changes to advanced directive? No - Patient declined  Copy of advanced directive(s) in chart? No - copy requested   Code status: Full Code  HPI 81 y/o male patient is seen for routine visit. He has insomnia. He denies any other concern this visit. He has been at his baseline per staff. He feeds himself and requires assistance with other ADLs. He needs 1 person assist with transfers.    Review of Systems   Constitutional: Negative for fever   HENT: Negative for congestion   Respiratory: Negative for cough, shortness of breath Cardiovascular: Negative for chest pain, palpitations Gastrointestinal: Negative for heartburn, nausea, vomiting, abdominal pain. Had bowel movement this am Genitourinary: Negative for dysuria.   Musculoskeletal: Negative for back pain and fall    Past Medical History:  Diagnosis Date  . Cataracts, bilateral   . Cerebrovascular accident ([place]) 2007  . Depression   . GERD (gastroesophageal reflux disease)   . Glaucoma   . Hypercholesterolemia   . Hypertension        Medication List       Accurate as of 09/22/15  2:12 PM. Always use your most recent med list.          atorvastatin 10 MG tablet Commonly known as:  LIPITOR Take one tablet by mouth once daily for cholesterol/CVA   baclofen 10 MG tablet Commonly known as:  LIORESAL Take 10 mg by mouth 2 (two) times daily. For Spastic Hemiparesis   calcium carbonate 1250 (500 Ca) MG tablet Commonly known as:  OS-CAL - dosed in mg of elemental calcium Take 1  tablet by mouth daily.   ferrous sulfate 325 (65 FE) MG tablet Take one tablet by mouth twice daily for anemia   lactulose 10 GM/15ML solution Commonly known as:  CHRONULAC Take 85ml by mouth twice daily for constipation   linaclotide 145 MCG Caps capsule Commonly known as:  LINZESS Take 145 mcg by mouth daily before breakfast.   loratadine 10 MG tablet Commonly known as:  CLARITIN Take 10 mg by mouth daily.   Melatonin 5 MG Tabs Take by mouth.   pantoprazole 40 MG tablet Commonly known as:  PROTONIX Take 40 mg by mouth daily. For GERD   polyethylene glycol packet Commonly known as:  MIRALAX / GLYCOLAX Take 17 g by mouth daily.   sennosides-docusate sodium 8.6-50 MG tablet Commonly known as:  SENOKOT-S Take 1 tablet by mouth 2 (two) times daily. For constipation   sertraline 25 MG tablet Commonly known as:  ZOLOFT Take one tablet by mouth every morning for depression   sucralfate 1 g tablet Commonly known as:  CARAFATE Take one tablet by mouth twice daily for reflux   SYSTANE BALANCE OP Apply 1 drop to eye 3 (three) times daily. Both eyes   traZODone 50 MG tablet Commonly known as:  DESYREL Take 25 mg by mouth at bedtime.   vitamin B-12 500 MCG tablet Commonly known as:  CYANOCOBALAMIN Take 500 mcg by mouth daily.  Vitamin D 2000 units Caps Take 4,000 Units by mouth daily. For Vitamin D Deficiency       Physical exam BP 122/76   Pulse 70   Temp 98.1 F (36.7 C) (Oral)   Resp 18   Ht 5\' 8"  (1.727 m)   Wt 213 lb 12.8 oz (97 kg)   BMI 32.51 kg/m    Wt Readings from Last 3 Encounters:  09/22/15 213 lb 12.8 oz (97 kg)  08/25/15 206 lb 12.8 oz (93.8 kg)  07/21/15 208 lb 6.4 oz (94.5 kg)   General- elderly obese male in no acute distress Head- atraumatic, normocephalic Eyes- no pallor, no discharge Neck- no lymphadenopathy Cardiovascular- normal s1,s2, no murmur, trace leg edema Respiratory- bilateral clear to auscultation, no wheeze, no  rhonchi, no crackles Abdomen- bowel sounds present, soft, non tender, scar of old peg tube Musculoskeletal- left sided weakness with hemiparesis, LUE contracture at elbow and wrist joint, splint in place, spasticity in left leg and arm, RUE and RLE adequate strength, can self propel on WC Neuro- left sided hemiparesis, has dysarthria Skin- warm and dry Psychiatry- alert and oriented to person   Labs   CBC Latest Ref Rng & Units 06/16/2015 06/14/2015 05/02/2015  WBC 10:3/mL 7.9 8.5 7.9  Hemoglobin 13.5 - 17.5 g/dL 12.7(A) 13.7 11.9(A)  Hematocrit 41 - 53 % 41 41 38(A)  Platelets 150 - 399 K/L 248 241 242   CMP Latest Ref Rng & Units 06/16/2015 06/14/2015 12/17/2014  Glucose 70 - 99 mg/dL - - -  BUN 4 - 21 mg/dL 9 9 8   Creatinine 0.6 - 1.3 mg/dL 0.8 0.8 1.0  Sodium 137 - 147 mmol/L 138 138 142  Potassium 3.4 - 5.3 mmol/L 4.0 4.4 4.2  Chloride 96 - 112 mEq/L - - -  CO2 19 - 32 mEq/L - - -  Calcium 8.4 - 10.5 mg/dL - - -  Total Protein 6.0 - 8.3 g/dL - - -  Total Bilirubin 0.3 - 1.2 mg/dL - - -  Alkaline Phos 25 - 125 U/L 78 78 68  AST 14 - 40 U/L 12(A) 14 10(A)  ALT 10 - 40 U/L 10 12 8(A)   Lipid Panel     Component Value Date/Time   CHOL 130 06/16/2015   TRIG 134 06/16/2015   HDL 29 (A) 06/16/2015   LDLCALC 75 06/16/2015   Lab Results  Component Value Date   HGBA1C 6.3 04/09/2015    KUB X-Ray Kidney, Ureter, Bladder 06/14/15 Impression. Generalized ileus   Assessment/plan  Dysphagia Monitor his po intake and to provide mechanical soft diet with thin liquids. Aspiration precautions and to have SLP to follow.   Iron def anemia Continue feso4 325 mg bid and monitor  Insomnia Change his trazodone to 50 mg daily for now and continue melatonin and monitor   Blanchie Serve, MD  [facility] [phone] (Monday-Friday 8 am - 5 pm) [phone] (afterhours)

## 2015-10-18 LAB — HEMOGLOBIN A1C: HEMOGLOBIN A1C: 6.2

## 2015-10-18 LAB — CBC AND DIFFERENTIAL
HCT: 36 % — AB (ref 41–53)
HEMOGLOBIN: 10.5 g/dL — AB (ref 13.5–17.5)
WBC: 8.2 10^3/mL

## 2015-10-18 LAB — TSH
TSH: 2.15 u[IU]/mL (ref 0.41–5.90)
TSH: 2.15 u[IU]/mL (ref 0.41–5.90)

## 2015-11-10 ENCOUNTER — Encounter: Payer: Self-pay | Admitting: Internal Medicine

## 2015-11-10 ENCOUNTER — Non-Acute Institutional Stay (SKILLED_NURSING_FACILITY): Payer: Medicare Other | Admitting: Internal Medicine

## 2015-11-10 DIAGNOSIS — M79604 Pain in right leg: Secondary | ICD-10-CM | POA: Diagnosis not present

## 2015-11-10 DIAGNOSIS — M79601 Pain in right arm: Secondary | ICD-10-CM | POA: Diagnosis not present

## 2015-11-10 DIAGNOSIS — K449 Diaphragmatic hernia without obstruction or gangrene: Secondary | ICD-10-CM

## 2015-11-10 DIAGNOSIS — R062 Wheezing: Secondary | ICD-10-CM

## 2015-11-10 DIAGNOSIS — K219 Gastro-esophageal reflux disease without esophagitis: Secondary | ICD-10-CM | POA: Diagnosis not present

## 2015-11-10 NOTE — Progress Notes (Signed)
Patient ID: Tyler Beard, male   DOB: [birth year], 81 y.o.   MRN: [id number]       Facility: [facility]  Chief Complaint  Patient presents with  . Medical Management of Chronic Issues    Routine Visit   No Known Allergies  Advanced Directives 12/17/2014  Does patient have an advance directive? Yes  Type of Advance Directive Out of facility DNR (pink MOST or yellow form)  Does patient want to make changes to advanced directive? No - Patient declined  Copy of advanced directive(s) in chart? No - copy requested   Code status: Full Code  HPI 81 y/o male patient is seen for routine visit. He complaints of pain to his right thigh x 1 week. He has not notified any staff. He denies any fall or trauma. he also has some shortness of breath with exertion. Denies other concern this visit. He has been at his baseline per staff. He feeds himself and requires assistance with other ADLs. He needs 1 person and bar assist with transfers.    Review of Systems   Constitutional: Negative for fever   HENT: Negative for congestion   Respiratory: Negative for cough. Cardiovascular: Negative for chest pain, palpitations Gastrointestinal: Negative for heartburn, nausea, vomiting, abdominal pain. Had bowel movement this am Genitourinary: Negative for dysuria.   Musculoskeletal: Negative for back pain and fall    Past Medical History:  Diagnosis Date  . Cataracts, bilateral   . Cerebrovascular accident ([place]) 2007  . Depression   . GERD (gastroesophageal reflux disease)   . Glaucoma   . Hypercholesterolemia   . Hypertension        Medication List       Accurate as of 11/10/15  2:21 PM. Always use your most recent med list.          atorvastatin 10 MG tablet Commonly known as:  LIPITOR Take one tablet by mouth once daily for cholesterol/CVA   baclofen 10 MG tablet Commonly known as:  LIORESAL Take 10 mg by mouth 2 (two) times daily. For Spastic  Hemiparesis   calcium carbonate 1250 (500 Ca) MG tablet Commonly known as:  OS-CAL - dosed in mg of elemental calcium Take 1 tablet by mouth daily.   ferrous sulfate 325 (65 FE) MG tablet Take one tablet by mouth twice daily for anemia   lactulose 10 GM/15ML solution Commonly known as:  CHRONULAC Take 53ml by mouth twice daily for constipation   linaclotide 145 MCG Caps capsule Commonly known as:  LINZESS Take 145 mcg by mouth daily before breakfast.   loratadine 10 MG tablet Commonly known as:  CLARITIN Take 10 mg by mouth daily.   Melatonin 5 MG Tabs Take by mouth.   pantoprazole 40 MG tablet Commonly known as:  PROTONIX Take 40 mg by mouth daily. For GERD   polyethylene glycol packet Commonly known as:  MIRALAX / GLYCOLAX Take 17 g by mouth daily.   sennosides-docusate sodium 8.6-50 MG tablet Commonly known as:  SENOKOT-S Take 1 tablet by mouth 2 (two) times daily. For constipation   sertraline 25 MG tablet Commonly known as:  ZOLOFT Take one tablet by mouth every morning for depression   sucralfate 1 g tablet Commonly known as:  CARAFATE Take one tablet by mouth twice daily for reflux   SYSTANE BALANCE OP Apply 1 drop to eye 3 (three) times daily. Both eyes   traZODone 50 MG tablet Commonly known as:  DESYREL  Take 25 mg by mouth daily.   vitamin B-12 500 MCG tablet Commonly known as:  CYANOCOBALAMIN Take 500 mcg by mouth daily.   Vitamin D 2000 units Caps Take 4,000 Units by mouth daily. For Vitamin D Deficiency       Physical exam BP 124/70   Pulse 80   Temp 98.7 F (37.1 C) (Oral)   Resp 20   Ht 5\' 8"  (1.727 m)   Wt 212 lb 12.8 oz (96.5 kg)   BMI 32.36 kg/m    Wt Readings from Last 3 Encounters:  11/10/15 212 lb 12.8 oz (96.5 kg)  09/22/15 213 lb 12.8 oz (97 kg)  08/25/15 206 lb 12.8 oz (93.8 kg)   General- elderly obese male in no acute distress Head- atraumatic, normocephalic Eyes- no pallor, no discharge Neck- no  lymphadenopathy Cardiovascular- normal s1,s2, no murmur, + leg edema right > left Respiratory- bilateral clear to auscultation, + wheeze, no rhonchi, no crackles Abdomen- bowel sounds present, soft, non tender, scar of old peg tube Musculoskeletal- left sided weakness with hemiparesis, LUE contracture at elbow and wrist joint, splint in place, spasticity in left leg and arm, RUE and RLE adequate strength, RLE edema with redness and tenderness on thigh area, can self propel on WC, no calf tenderness Neuro- left sided hemiparesis, has dysarthria Skin- warm and dry, erythema to right thigh as above Psychiatry- alert and oriented to person   Labs   CBC Latest Ref Rng & Units 10/18/2015 06/16/2015 06/14/2015  WBC 10:3/mL 8.2 7.9 8.5  Hemoglobin 13.5 - 17.5 g/dL 10.5(A) 12.7(A) 13.7  Hematocrit 41 - 53 % 36(A) 41 41  Platelets 150 - 399 K/L - 248 241   CMP Latest Ref Rng & Units 06/16/2015 06/14/2015 12/17/2014  Glucose 70 - 99 mg/dL - - -  BUN 4 - 21 mg/dL 9 9 8   Creatinine 0.6 - 1.3 mg/dL 0.8 0.8 1.0  Sodium 137 - 147 mmol/L 138 138 142  Potassium 3.4 - 5.3 mmol/L 4.0 4.4 4.2  Chloride 96 - 112 mEq/L - - -  CO2 19 - 32 mEq/L - - -  Calcium 8.4 - 10.5 mg/dL - - -  Total Protein 6.0 - 8.3 g/dL - - -  Total Bilirubin 0.3 - 1.2 mg/dL - - -  Alkaline Phos 25 - 125 U/L 78 78 68  AST 14 - 40 U/L 12(A) 14 10(A)  ALT 10 - 40 U/L 10 12 8(A)   Lipid Panel     Component Value Date/Time   CHOL 130 06/16/2015   TRIG 134 06/16/2015   HDL 29 (A) 06/16/2015   LDLCALC 75 06/16/2015   Lab Results  Component Value Date   HGBA1C 6.2 10/18/2015     Assessment/plan  Right leg pain With erythema, warmth and tenderness on exam along with swelling. Concern for cellulitis vs fluid overload vs DVT. Start him empirically on doxycycline 100 mg q12h x 5 days. Check cbc with diff and cmp. Get cxr to rule out pulmonary edema. If no improvement with doxycycline and lab work negative for infection, if swelling and  pain worsens, consider venous doppler to rule out DVT.   Wheezing Likely has cardiac wheezing with pulmonary edema. Add duoneb q8h prn for now and get chest xray to assess for vascular congestion. Hold off on lasix until review of cxr.   gerd Stable, continue carafate and protonix    Blanchie Serve, MD  [facility] [phone] (Monday-Friday 8 am - 5 pm) [phone] (  afterhours)

## 2015-11-11 LAB — CBC AND DIFFERENTIAL
HCT: 31 % — AB (ref 41–53)
Hemoglobin: 9.6 g/dL — AB (ref 13.5–17.5)
PLATELETS: 236 10*3/uL (ref 150–399)
WBC: 8.6 10*3/mL

## 2015-11-11 LAB — BASIC METABOLIC PANEL
BUN: 9 mg/dL (ref 4–21)
CREATININE: 0.8 mg/dL (ref 0.6–1.3)
Glucose: 111 mg/dL
Potassium: 4 mmol/L (ref 3.4–5.3)
SODIUM: 137 mmol/L (ref 137–147)

## 2015-11-16 DIAGNOSIS — I82409 Acute embolism and thrombosis of unspecified deep veins of unspecified lower extremity: Secondary | ICD-10-CM

## 2015-11-16 HISTORY — DX: Acute embolism and thrombosis of unspecified deep veins of unspecified lower extremity: I82.409

## 2015-11-17 LAB — CBC AND DIFFERENTIAL
HEMATOCRIT: 31 % — AB (ref 41–53)
HEMOGLOBIN: 9.5 g/dL — AB (ref 13.5–17.5)
PLATELETS: 336 10*3/uL (ref 150–399)
WBC: 7.8 10*3/mL

## 2015-12-04 ENCOUNTER — Non-Acute Institutional Stay (SKILLED_NURSING_FACILITY): Payer: Medicare Other | Admitting: Internal Medicine

## 2015-12-04 ENCOUNTER — Encounter: Payer: Self-pay | Admitting: Internal Medicine

## 2015-12-04 DIAGNOSIS — E7849 Other hyperlipidemia: Secondary | ICD-10-CM

## 2015-12-04 DIAGNOSIS — I82491 Acute embolism and thrombosis of other specified deep vein of right lower extremity: Secondary | ICD-10-CM

## 2015-12-04 DIAGNOSIS — E784 Other hyperlipidemia: Secondary | ICD-10-CM | POA: Diagnosis not present

## 2015-12-04 DIAGNOSIS — F329 Major depressive disorder, single episode, unspecified: Secondary | ICD-10-CM

## 2015-12-04 DIAGNOSIS — D508 Other iron deficiency anemias: Secondary | ICD-10-CM | POA: Diagnosis not present

## 2015-12-04 NOTE — Progress Notes (Signed)
Patient ID: Tyler Beard, male   DOB: [birth year], 81 y.o.   MRN: [id number]       Facility: [facility]  Chief Complaint  Patient presents with  . Medical Management of Chronic Issues    Routine Visit   No Known Allergies  Advanced Directives 12/17/2014  Does patient have an advance directive? Yes  Type of Advance Directive Out of facility DNR (pink MOST or yellow form)  Does patient want to make changes to advanced directive? No - Patient declined  Copy of advanced directive(s) in chart? No - copy requested   Code status: full code  HPI 81 y/o male patient is seen for routine visit. He denies any concern. He has been diagnosed with right leg DVT. He is now on coumadin. He is tolerating it well.   Review of Systems   Constitutional: Negative for fever   HENT: Negative for congestion   Respiratory: Negative for cough Cardiovascular: Negative for chest pain, palpitations Gastrointestinal: Negative for heartburn, nausea, vomiting, abdominal pain. Genitourinary: Negative for dysuria.   Musculoskeletal: Negative for back pain and fall    Past Medical History:  Diagnosis Date  . Cataracts, bilateral   . Cerebrovascular accident ([place]) 2007  . Depression   . GERD (gastroesophageal reflux disease)   . Glaucoma   . Hypercholesterolemia   . Hypertension        Medication List       Accurate as of 12/04/15  2:38 PM. Always use your most recent med list.          atorvastatin 10 MG tablet Commonly known as:  LIPITOR Take one tablet by mouth once daily for cholesterol/CVA   baclofen 10 MG tablet Commonly known as:  LIORESAL Take 10 mg by mouth 2 (two) times daily. For Spastic Hemiparesis   calcium carbonate 1250 (500 Ca) MG tablet Commonly known as:  OS-CAL - dosed in mg of elemental calcium Take 1 tablet by mouth daily.   ferrous sulfate 325 (65 FE) MG tablet Take one tablet by mouth twice daily for anemia     ipratropium-albuterol 0.5-2.5 (3) MG/3ML Soln Commonly known as:  DUONEB Take 3 mLs by nebulization every 8 (eight) hours as needed.   lactulose 10 GM/15ML solution Commonly known as:  CHRONULAC Take 90ml by mouth twice daily for constipation   linaclotide 145 MCG Caps capsule Commonly known as:  LINZESS Take 145 mcg by mouth daily before breakfast.   loratadine 10 MG tablet Commonly known as:  CLARITIN Take 10 mg by mouth daily.   Melatonin 5 MG Tabs Take 5 mg by mouth at bedtime.   pantoprazole 40 MG tablet Commonly known as:  PROTONIX Take 40 mg by mouth daily. For GERD   polyethylene glycol packet Commonly known as:  MIRALAX / GLYCOLAX Take 17 g by mouth daily.   sennosides-docusate sodium 8.6-50 MG tablet Commonly known as:  SENOKOT-S Take 1 tablet by mouth 2 (two) times daily. For constipation   sertraline 25 MG tablet Commonly known as:  ZOLOFT Take one tablet by mouth every morning for depression   sucralfate 1 g tablet Commonly known as:  CARAFATE Take one tablet by mouth twice daily for reflux   SYSTANE BALANCE OP Apply 1 drop to eye 3 (three) times daily. Both eyes   traZODone 50 MG tablet Commonly known as:  DESYREL Take 50 mg by mouth at bedtime.   vitamin B-12 500 MCG tablet Commonly known as:  CYANOCOBALAMIN  Take 500 mcg by mouth daily.   Vitamin D 2000 units Caps Take 4,000 Units by mouth daily. For Vitamin D Deficiency   warfarin 2 MG tablet Commonly known as:  COUMADIN Take 2 mg by mouth daily. Give with 2.5 mg tablet to equal 4.5 mg daily   warfarin 2.5 MG tablet Commonly known as:  COUMADIN Take 2.5 mg by mouth daily.       Physical exam BP (!) 138/92   Pulse 81   Temp 97.8 F (36.6 C) (Oral)   Resp 20   Ht 5\' 8"  (1.727 m)   Wt 211 lb 9.6 oz (96 kg)   SpO2 97%   BMI 32.17 kg/m    Wt Readings from Last 3 Encounters:  12/04/15 211 lb 9.6 oz (96 kg)  11/10/15 212 lb 12.8 oz (96.5 kg)  09/22/15 213 lb 12.8 oz (97 kg)    General- elderly obese male in no acute distress Head- atraumatic, normocephalic Eyes- no pallor, no discharge Neck- no lymphadenopathy Cardiovascular- normal s1,s2, no murmur, + leg edema Respiratory- bilateral clear to auscultation, nowheeze, no rhonchi, no crackles Abdomen- bowel sounds present, soft, non tender, scar of old peg tube Musculoskeletal- left sided weakness with hemiparesis, LUE contracture at elbow and wrist joint, splint in place, spasticity in left leg and arm, RUE and RLE adequate strength Neuro- left sided hemiparesis, has dysarthria Skin- warm and dry Psychiatry- alert and oriented to person   Labs   CBC Latest Ref Rng & Units 11/11/2015 10/18/2015 06/16/2015  WBC 10:3/mL 8.6 8.2 7.9  Hemoglobin 13.5 - 17.5 g/dL 9.6(A) 10.5(A) 12.7(A)  Hematocrit 41 - 53 % 31(A) 36(A) 41  Platelets 150 - 399 K/L 236 - 248   CMP Latest Ref Rng & Units 11/11/2015 06/16/2015 06/14/2015  Glucose 70 - 99 mg/dL - - -  BUN 4 - 21 mg/dL 9 9 9   Creatinine 0.6 - 1.3 mg/dL 0.8 0.8 0.8  Sodium 137 - 147 mmol/L 137 138 138  Potassium 3.4 - 5.3 mmol/L 4.0 4.0 4.4  Chloride 96 - 112 mEq/L - - -  CO2 19 - 32 mEq/L - - -  Calcium 8.4 - 10.5 mg/dL - - -  Total Protein 6.0 - 8.3 g/dL - - -  Total Bilirubin 0.3 - 1.2 mg/dL - - -  Alkaline Phos 25 - 125 U/L - 78 78  AST 14 - 40 U/L - 12(A) 14  ALT 10 - 40 U/L - 10 12   Lipid Panel     Component Value Date/Time   CHOL 130 06/16/2015   TRIG 134 06/16/2015   HDL 29 (A) 06/16/2015   LDLCALC 75 06/16/2015   Lab Results  Component Value Date   HGBA1C 6.2 10/18/2015     Assessment/plan  Right leg DVT Started on coumadin and lovenox, now off lovenox. Continue coumadin with goal inr 2-3  Chronic depression Room has been changed, he likes his room, mood stable, continue sertraline 25 mg daily with trazodone   Iron def anemia Monitor cbc, continue feso4 bid  Hyperlipidemia Lipid Panel     Component Value Date/Time   CHOL 130  06/16/2015   TRIG 134 06/16/2015   HDL 29 (A) 06/16/2015   LDLCALC 75 06/16/2015  LDL at goal. Continue atorvastatin 10 mg daily    Blanchie Serve, MD Internal Medicine [facility] [place], [place] [address] Cell Phone (Monday-Friday 8 am - 5 pm): [phone] On Call: [phone] and  follow prompts after 5 pm and on weekends Office Phone: [phone] Office Fax: [phone]

## 2015-12-11 ENCOUNTER — Encounter: Payer: Self-pay | Admitting: Internal Medicine

## 2015-12-11 ENCOUNTER — Non-Acute Institutional Stay (SKILLED_NURSING_FACILITY): Payer: Medicare Other | Admitting: Internal Medicine

## 2015-12-11 ENCOUNTER — Inpatient Hospital Stay (HOSPITAL_COMMUNITY): Admit: 2015-12-11 | Payer: BC Managed Care – PPO

## 2015-12-11 ENCOUNTER — Emergency Department (HOSPITAL_COMMUNITY): Payer: Medicare Other

## 2015-12-11 ENCOUNTER — Inpatient Hospital Stay (HOSPITAL_COMMUNITY)
Admission: EM | Admit: 2015-12-11 | Discharge: 2015-12-13 | DRG: 812 | Disposition: A | Discharge status: Hospice | Payer: Medicare Other | Attending: Internal Medicine | Admitting: Internal Medicine

## 2015-12-11 ENCOUNTER — Encounter (HOSPITAL_COMMUNITY): Payer: Self-pay

## 2015-12-11 DIAGNOSIS — R6510 Systemic inflammatory response syndrome (SIRS) of non-infectious origin without acute organ dysfunction: Secondary | ICD-10-CM

## 2015-12-11 DIAGNOSIS — Z8719 Personal history of other diseases of the digestive system: Secondary | ICD-10-CM

## 2015-12-11 DIAGNOSIS — K921 Melena: Secondary | ICD-10-CM | POA: Diagnosis present

## 2015-12-11 DIAGNOSIS — K922 Gastrointestinal hemorrhage, unspecified: Secondary | ICD-10-CM | POA: Diagnosis present

## 2015-12-11 DIAGNOSIS — D696 Thrombocytopenia, unspecified: Secondary | ICD-10-CM | POA: Diagnosis present

## 2015-12-11 DIAGNOSIS — C787 Secondary malignant neoplasm of liver and intrahepatic bile duct: Secondary | ICD-10-CM | POA: Diagnosis present

## 2015-12-11 DIAGNOSIS — I69351 Hemiplegia and hemiparesis following cerebral infarction affecting right dominant side: Secondary | ICD-10-CM | POA: Diagnosis not present

## 2015-12-11 DIAGNOSIS — F329 Major depressive disorder, single episode, unspecified: Secondary | ICD-10-CM | POA: Diagnosis present

## 2015-12-11 DIAGNOSIS — Z6832 Body mass index (BMI) 32.0-32.9, adult: Secondary | ICD-10-CM

## 2015-12-11 DIAGNOSIS — I6930 Unspecified sequelae of cerebral infarction: Secondary | ICD-10-CM

## 2015-12-11 DIAGNOSIS — I824Z3 Acute embolism and thrombosis of unspecified deep veins of distal lower extremity, bilateral: Secondary | ICD-10-CM | POA: Diagnosis not present

## 2015-12-11 DIAGNOSIS — R000 Tachycardia, unspecified: Secondary | ICD-10-CM | POA: Diagnosis present

## 2015-12-11 DIAGNOSIS — E559 Vitamin D deficiency, unspecified: Secondary | ICD-10-CM | POA: Diagnosis present

## 2015-12-11 DIAGNOSIS — I82412 Acute embolism and thrombosis of left femoral vein: Secondary | ICD-10-CM | POA: Diagnosis present

## 2015-12-11 DIAGNOSIS — I69354 Hemiplegia and hemiparesis following cerebral infarction affecting left non-dominant side: Secondary | ICD-10-CM

## 2015-12-11 DIAGNOSIS — M7989 Other specified soft tissue disorders: Secondary | ICD-10-CM | POA: Diagnosis not present

## 2015-12-11 DIAGNOSIS — I10 Essential (primary) hypertension: Secondary | ICD-10-CM | POA: Diagnosis present

## 2015-12-11 DIAGNOSIS — Z515 Encounter for palliative care: Secondary | ICD-10-CM | POA: Diagnosis present

## 2015-12-11 DIAGNOSIS — E871 Hypo-osmolality and hyponatremia: Secondary | ICD-10-CM | POA: Diagnosis present

## 2015-12-11 DIAGNOSIS — D372 Neoplasm of uncertain behavior of small intestine: Secondary | ICD-10-CM | POA: Diagnosis present

## 2015-12-11 DIAGNOSIS — Z86711 Personal history of pulmonary embolism: Secondary | ICD-10-CM

## 2015-12-11 DIAGNOSIS — E869 Volume depletion, unspecified: Secondary | ICD-10-CM | POA: Diagnosis present

## 2015-12-11 DIAGNOSIS — D6859 Other primary thrombophilia: Secondary | ICD-10-CM | POA: Diagnosis present

## 2015-12-11 DIAGNOSIS — Z86718 Personal history of other venous thrombosis and embolism: Secondary | ICD-10-CM

## 2015-12-11 DIAGNOSIS — I82409 Acute embolism and thrombosis of unspecified deep veins of unspecified lower extremity: Secondary | ICD-10-CM | POA: Diagnosis present

## 2015-12-11 DIAGNOSIS — R509 Fever, unspecified: Secondary | ICD-10-CM | POA: Diagnosis present

## 2015-12-11 DIAGNOSIS — E46 Unspecified protein-calorie malnutrition: Secondary | ICD-10-CM | POA: Diagnosis present

## 2015-12-11 DIAGNOSIS — E7800 Pure hypercholesterolemia, unspecified: Secondary | ICD-10-CM | POA: Diagnosis present

## 2015-12-11 DIAGNOSIS — K219 Gastro-esophageal reflux disease without esophagitis: Secondary | ICD-10-CM | POA: Diagnosis present

## 2015-12-11 DIAGNOSIS — D62 Acute posthemorrhagic anemia: Principal | ICD-10-CM | POA: Diagnosis present

## 2015-12-11 DIAGNOSIS — Z993 Dependence on wheelchair: Secondary | ICD-10-CM

## 2015-12-11 DIAGNOSIS — I82411 Acute embolism and thrombosis of right femoral vein: Secondary | ICD-10-CM | POA: Diagnosis not present

## 2015-12-11 DIAGNOSIS — M79662 Pain in left lower leg: Secondary | ICD-10-CM

## 2015-12-11 DIAGNOSIS — M79605 Pain in left leg: Secondary | ICD-10-CM | POA: Diagnosis present

## 2015-12-11 DIAGNOSIS — Z7901 Long term (current) use of anticoagulants: Secondary | ICD-10-CM

## 2015-12-11 DIAGNOSIS — Z79899 Other long term (current) drug therapy: Secondary | ICD-10-CM

## 2015-12-11 DIAGNOSIS — I82402 Acute embolism and thrombosis of unspecified deep veins of left lower extremity: Secondary | ICD-10-CM | POA: Diagnosis not present

## 2015-12-11 DIAGNOSIS — D649 Anemia, unspecified: Secondary | ICD-10-CM | POA: Diagnosis present

## 2015-12-11 HISTORY — DX: Anemia, unspecified: D64.9

## 2015-12-11 HISTORY — DX: Acute embolism and thrombosis of unspecified deep veins of unspecified lower extremity: I82.409

## 2015-12-11 LAB — BASIC METABOLIC PANEL
ANION GAP: 11 (ref 5–15)
BUN: 13 mg/dL (ref 6–20)
CHLORIDE: 94 mmol/L — AB (ref 101–111)
CO2: 26 mmol/L (ref 22–32)
Calcium: 8.5 mg/dL — ABNORMAL LOW (ref 8.9–10.3)
Creatinine, Ser: 0.83 mg/dL (ref 0.61–1.24)
Glucose, Bld: 162 mg/dL — ABNORMAL HIGH (ref 65–99)
POTASSIUM: 3.6 mmol/L (ref 3.5–5.1)
SODIUM: 131 mmol/L — AB (ref 135–145)

## 2015-12-11 LAB — CBC WITH DIFFERENTIAL/PLATELET
BASOS ABS: 0 10*3/uL (ref 0.0–0.1)
BASOS PCT: 0 %
EOS ABS: 0.3 10*3/uL (ref 0.0–0.7)
EOS PCT: 2 %
HCT: 21.1 % — ABNORMAL LOW (ref 39.0–52.0)
HEMOGLOBIN: 6.5 g/dL — AB (ref 13.0–17.0)
Lymphocytes Relative: 14 %
Lymphs Abs: 2 10*3/uL (ref 0.7–4.0)
MCH: 24.9 pg — AB (ref 26.0–34.0)
MCHC: 30.8 g/dL (ref 30.0–36.0)
MCV: 80.8 fL (ref 78.0–100.0)
Monocytes Absolute: 1.2 10*3/uL — ABNORMAL HIGH (ref 0.1–1.0)
Monocytes Relative: 8 %
NEUTROS PCT: 76 %
Neutro Abs: 10.9 10*3/uL — ABNORMAL HIGH (ref 1.7–7.7)
PLATELETS: 138 10*3/uL — AB (ref 150–400)
RBC: 2.61 MIL/uL — AB (ref 4.22–5.81)
RDW: 15.8 % — ABNORMAL HIGH (ref 11.5–15.5)
WBC: 14.3 10*3/uL — AB (ref 4.0–10.5)

## 2015-12-11 LAB — PROTIME-INR
INR: 2.04
PROTHROMBIN TIME: 23.3 s — AB (ref 11.4–15.2)

## 2015-12-11 LAB — I-STAT CG4 LACTIC ACID, ED
LACTIC ACID, VENOUS: 3.18 mmol/L — AB (ref 0.5–1.9)
LACTIC ACID, VENOUS: 1.54 mmol/L (ref 0.5–1.9)

## 2015-12-11 LAB — PREPARE RBC (CROSSMATCH)

## 2015-12-11 LAB — BRAIN NATRIURETIC PEPTIDE: B NATRIURETIC PEPTIDE 5: 18.8 pg/mL (ref 0.0–100.0)

## 2015-12-11 LAB — TROPONIN I

## 2015-12-11 MED ORDER — FAMOTIDINE IN NACL 20-0.9 MG/50ML-% IV SOLN
20.0000 mg | Freq: Two times a day (BID) | INTRAVENOUS | Status: DC
  Administered 2015-12-11: 20 mg via INTRAVENOUS
  Filled 2015-12-11: qty 50

## 2015-12-11 MED ORDER — SODIUM CHLORIDE 0.9 % IV SOLN
10.0000 mL/h | Freq: Once | INTRAVENOUS | Status: AC
  Administered 2015-12-11: 10 mL/h via INTRAVENOUS

## 2015-12-11 MED ORDER — IOPAMIDOL (ISOVUE-370) INJECTION 76%
INTRAVENOUS | Status: AC
  Administered 2015-12-11: 80 mL
  Filled 2015-12-11: qty 100

## 2015-12-11 MED ORDER — SODIUM CHLORIDE 0.9 % IV BOLUS (SEPSIS)
1000.0000 mL | Freq: Once | INTRAVENOUS | Status: AC
  Administered 2015-12-11: 1000 mL via INTRAVENOUS

## 2015-12-11 NOTE — ED Triage Notes (Signed)
Patient presents to the er with ems, he lives in a nursing facility and has been complaining of pain in his left leg that is warm to touch, he had an ultra sound done and has confirmed 5 blood clots in his left leg and a possible 6th one, cms intact.  He recently had a blood clot in his right leg two weeks ago and is on eliquis for that.  He is non-ambulatory, alert and oriented, slurred speech from a previous stroke.

## 2015-12-11 NOTE — Progress Notes (Addendum)
Patient ID: Tyler Beard, male   DOB: 1934/06/15, 81 y.o.   MRN: [id number]       Facility: [facility]  Chief Complaint  Patient presents with  . Acute Visit    Left leg edema   No Known Allergies  Advanced Directives 12/17/2014  Does patient have an advance directive? Yes  Type of Advance Directive Out of facility DNR (pink MOST or yellow form)  Does patient want to make changes to advanced directive? No - Patient declined  Copy of advanced directive(s) in chart? No - copy requested   Code status: full code  HPI 81 y/o male patient is seen for acute visit. He has new swelling to left leg x 3 days with pain. He denies any trauma or fall. No witnessed trauma per staff. He recently had right leg DTV and is currently on couamdin.   Review of Systems   Constitutional: Negative for fever   HENT: Negative for congestion   Respiratory: Negative for cough and dyspnea Cardiovascular: Negative for chest pain, palpitations Gastrointestinal: Negative for abdominal pain.  Genitourinary: Negative for dysuria.   Musculoskeletal: Negative for back pain and fall    Past Medical History:  Diagnosis Date  . Cataracts, bilateral   . Cerebrovascular accident ([place]) 2007  . Depression   . GERD (gastroesophageal reflux disease)   . Glaucoma   . Hypercholesterolemia   . Hypertension        Medication List       Accurate as of 12/11/15 12:07 PM. Always use your most recent med list.          atorvastatin 10 MG tablet Commonly known as:  LIPITOR Take one tablet by mouth once daily for cholesterol/CVA   baclofen 10 MG tablet Commonly known as:  LIORESAL Take 10 mg by mouth 2 (two) times daily. For Spastic Hemiparesis   calcium carbonate 1250 (500 Ca) MG tablet Commonly known as:  OS-CAL - dosed in mg of elemental calcium Take 1 tablet by mouth daily.   enoxaparin 80 MG/0.8ML injection Commonly known as:  LOVENOX Inject 80 mg into the  skin every 12 (twelve) hours.   ferrous sulfate 325 (65 FE) MG tablet Take one tablet by mouth twice daily for anemia   ipratropium-albuterol 0.5-2.5 (3) MG/3ML Soln Commonly known as:  DUONEB Take 3 mLs by nebulization every 8 (eight) hours as needed.   lactulose 10 GM/15ML solution Commonly known as:  CHRONULAC Take 30ml by mouth twice daily for constipation   linaclotide 145 MCG Caps capsule Commonly known as:  LINZESS Take 145 mcg by mouth daily before breakfast.   loratadine 10 MG tablet Commonly known as:  CLARITIN Take 10 mg by mouth daily.   Melatonin 5 MG Tabs Take 5 mg by mouth at bedtime.   pantoprazole 40 MG tablet Commonly known as:  PROTONIX Take 40 mg by mouth daily. For GERD   polyethylene glycol packet Commonly known as:  MIRALAX / GLYCOLAX Take 17 g by mouth daily.   sennosides-docusate sodium 8.6-50 MG tablet Commonly known as:  SENOKOT-S Take 1 tablet by mouth 2 (two) times daily. For constipation   sertraline 25 MG tablet Commonly known as:  ZOLOFT Take one tablet by mouth every morning for depression   sucralfate 1 g tablet Commonly known as:  CARAFATE Take 1 g by mouth 4 (four) times daily. Take one tablet by mouth twice daily for reflux   SYSTANE BALANCE OP Apply 1 drop  to eye 3 (three) times daily. Both eyes   traZODone 50 MG tablet Commonly known as:  DESYREL Take 50 mg by mouth at bedtime.   vitamin B-12 500 MCG tablet Commonly known as:  CYANOCOBALAMIN Take 500 mcg by mouth daily.   Vitamin D 2000 units Caps Take 4,000 Units by mouth daily. For Vitamin D Deficiency   warfarin 2 MG tablet Commonly known as:  COUMADIN Take 2 mg by mouth daily. Give with 2.5 mg tablet to equal 4.5 mg daily   warfarin 2.5 MG tablet Commonly known as:  COUMADIN Take 2.5 mg by mouth daily.       Physical exam BP 132/72   Pulse 78   Resp 20   Ht 5\' 8"  (1.727 m)   Wt 211 lb 9.6 oz (96 kg)   BMI 32.17 kg/m    Wt Readings from Last 3  Encounters:  12/11/15 211 lb 9.6 oz (96 kg)  12/04/15 211 lb 9.6 oz (96 kg)  11/10/15 212 lb 12.8 oz (96.5 kg)   General- elderly obese and chronically ill appearing male in no acute distress Head- atraumatic, normocephalic Eyes- no pallor, no discharge Neck- no lymphadenopathy Cardiovascular- normal s1,s2, no murmur Respiratory- bilateral clear to auscultation, nowheeze, no rhonchi, no crackles Abdomen- bowel sounds present, soft, non tender, scar of old peg tube Musculoskeletal- left sided weakness with hemiparesis, LUE contracture at elbow and wrist joint, splint in place, right leg trace edema, left leg 1+ edema extending to his thigh, spasticity in left leg and arm, RUE and RLE adequate strength, palpable right dorsalis pedis, non palpable left dorsalis pedis but has good capillary refill Neuro- left sided hemiparesis, has dysarthria Skin- warm and dry Psychiatry- alert and oriented to person and place   Labs   CBC Latest Ref Rng & Units 11/17/2015 11/11/2015 10/18/2015  WBC 10:3/mL 7.8 8.6 8.2  Hemoglobin 13.5 - 17.5 g/dL 9.5(A) 9.6(A) 10.5(A)  Hematocrit 41 - 53 % 31(A) 31(A) 36(A)  Platelets 150 - 399 K/L 336 236 -   CMP Latest Ref Rng & Units 11/11/2015 06/16/2015 06/14/2015  Glucose 70 - 99 mg/dL - - -  BUN 4 - 21 mg/dL 9 9 9   Creatinine 0.6 - 1.3 mg/dL 0.8 0.8 0.8  Sodium 137 - 147 mmol/L 137 138 138  Potassium 3.4 - 5.3 mmol/L 4.0 4.0 4.4  Chloride 96 - 112 mEq/L - - -  CO2 19 - 32 mEq/L - - -  Calcium 8.4 - 10.5 mg/dL - - -  Total Protein 6.0 - 8.3 g/dL - - -  Total Bilirubin 0.3 - 1.2 mg/dL - - -  Alkaline Phos 25 - 125 U/L - 78 78  AST 14 - 40 U/L - 12(A) 14  ALT 10 - 40 U/L - 10 12   Lipid Panel     Component Value Date/Time   CHOL 130 06/16/2015   TRIG 134 06/16/2015   HDL 29 (A) 06/16/2015   LDLCALC 75 06/16/2015   Lab Results  Component Value Date   HGBA1C 6.2 10/18/2015     Assessment/plan  Left leg swelling With recent hx of dvt to RLE and new  swelling to left leg, rule out acute DVT. get venous doppler ultrasound to evaluate further. No signs of cellulitis on exam. Continue coumadin for anticoagulation for now. Good capillary refill to the leg at present. He had history of recurrent DVT and has had Pulmonary embolism in past and IVC filter in 2007.   Right leg  DVT Currently on coumadin and coumadin has been on hold with supratherapuetic inr of 3.3 yesterday. inr today is 2.2. Start coumadin 3 mg qhs for now and check inr 12/12/15  Recurrent DVT Will get hematology referral to evaluate for treatment option given his recurrent DVT and PE in past and has IVC filter.   History of GI bleed With villous adenoma. Will need anticoagulation given his recurrent blood clots but has high risk for gi bleed. At present, the benefit of anticoagulation is more than the risk. Wife would like to proceed with treatment. Get GI referral to evaluate further on risk and care plan with gi bleed. No gi bleed reported at present. Monitor h&h.    I have called Devrick Forestier, wife of the patient and left a message on her phone to update her on the care plan. Nursing supervisor present during visit and telephone call.    Blanchie Serve, MD Internal Medicine [place], [place] [address] Cell Phone (Monday-Friday 8 am - 5 pm): [phone] On Call: [phone] and follow prompts after 5 pm and on weekends Office Phone: [phone] Office Fax: [phone]   Left leg venous doppler 12/11/15- thrombus formation in common femoral, proximal superficial femoral, mid superficial femoral, popliteal and peroneal veins. There is noncompressibility. There is no phasic flow. Visualized posterior tibial vein shows no evidence of thrombus formation.  With his occulsive acute DVT to LLE, pain and swelling of left leg and no palpable distal pulse and recurrent DVTs will send patient to ED for evaluation of  possible thrombolytic vs thrombectomy.

## 2015-12-11 NOTE — ED Provider Notes (Signed)
[facility] DEPT Provider Note   CSN: [id number] Arrival date & time: 12/11/15  1535  History   Chief Complaint Chief Complaint  Patient presents with  . DVT    HPI Tyler Beard is a 81 y.o. male.   Leg Pain   This is a new problem. The current episode started more than 2 days ago. The problem occurs constantly. The problem has been gradually worsening. The pain is present in the left lower leg, left upper leg and left hip. The quality of the pain is described as aching. The pain is at a severity of 8/10. The pain is moderate. Pertinent negatives include no numbness.    Past Medical History:  Diagnosis Date  . Cataracts, bilateral   . Cerebrovascular accident ([place]) 2007  . Depression   . GERD (gastroesophageal reflux disease)   . Glaucoma   . Hypercholesterolemia   . Hypertension     Patient Active Problem List   Diagnosis Date Noted  . Oropharyngeal dysphagia 09/22/2015  . Insomnia 08/25/2015  . History of CVA with residual deficit 08/25/2015  . B12 deficiency 12/22/2014  . Duodenal ulcer 12/22/2014  . Chronic constipation 09/14/2014  . Hyperlipidemia LDL goal <100 06/24/2014  . Hiatal hernia with gastroesophageal reflux 06/24/2014  . Left spastic hemiparesis ([place]) 06/24/2014  . Loss of weight 06/24/2014  . Chronic depression 04/29/2014  . Vitamin D deficiency 04/29/2014  . Duodenal bulb ulcer 04/29/2014  . IBS (irritable bowel syndrome) 03/14/2014  . Hyperlipidemia 03/14/2014  . Dry eye syndrome of both lacrimal glands 02/14/2014  . Chronic recurrent major depressive disorder ([place]) 02/14/2014  . 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 Irritable bowel syndrome 02/14/2014  . Anemia, iron deficiency 11/08/2013  . Villous adenoma of duodenum 09/13/2013  . Mixed hyperlipidemia 09/02/2013  . Peripheral vascular disease, unspecified 09/02/2013  . Hemiplegia affecting dominant side, late effect of cerebrovascular disease  09/02/2013  . Benign hypertensive heart disease without heart failure 05/20/2013  . Anemia, unspecified 05/20/2013  . Unspecified vitamin D deficiency 03/05/2013  . Hiatal hernia 03/05/2013  . Depression due to stroke ([place]) 03/05/2013  . Other and unspecified hyperlipidemia 01/07/2013  . Pure hypercholesterolemia 08/29/2012  . Esophageal reflux 08/29/2012  . Nausea and vomiting 12/29/2011  . Constipation 12/29/2011  . ANEMIA, HYPOCHROMIC 03/24/2009  . CVA 03/24/2009  . HYPOKALEMIA 09/06/2008  . BENIGN NEOPLASM OF DUODENUM JEJUNUM AND ILEUM 02/06/2007    Past Surgical History:  Procedure Laterality Date  . aphagia    . cataract surgery     bilateral  . ESOPHAGOGASTRODUODENOSCOPY  01/10/2012   Procedure: ESOPHAGOGASTRODUODENOSCOPY (EGD);  Surgeon: Inda Castle, MD;  Location: [facility];  Service: Endoscopy;  Laterality: N/A;  . Section of villous adenoma of colon         Home Medications    Prior to Admission medications   Medication Sig Start Date End Date Taking? Authorizing Provider  atorvastatin (LIPITOR) 10 MG tablet Take one tablet by mouth once daily for cholesterol/CVA    Historical Provider, MD  baclofen (LIORESAL) 10 MG tablet Take 10 mg by mouth 2 (two) times daily. For Spastic Hemiparesis    Historical Provider, MD  calcium carbonate (OS-CAL - DOSED IN MG OF ELEMENTAL CALCIUM) 1250 (500 Ca) MG tablet Take 1 tablet by mouth daily.    Historical Provider, MD  Cholecalciferol (VITAMIN D) 2000 UNITS CAPS Take 4,000 Units by mouth daily. For Vitamin D Deficiency    Historical Provider, MD  enoxaparin (LOVENOX) 80  MG/0.8ML injection Inject 80 mg into the skin every 12 (twelve) hours.    Historical Provider, MD  ferrous sulfate 325 (65 FE) MG tablet Take one tablet by mouth twice daily for anemia    Historical Provider, MD  ipratropium-albuterol (DUONEB) 0.5-2.5 (3) MG/3ML SOLN Take 3 mLs by nebulization every 8 (eight) hours as needed.    Historical Provider, MD    lactulose (CHRONULAC) 10 GM/15ML solution Take 45ml by mouth twice daily for constipation    Historical Provider, MD  linaclotide (LINZESS) 145 MCG CAPS capsule Take 145 mcg by mouth daily before breakfast.    Historical Provider, MD  loratadine (CLARITIN) 10 MG tablet Take 10 mg by mouth daily.    Historical Provider, MD  Melatonin 5 MG TABS Take 5 mg by mouth at bedtime.     Historical Provider, MD  pantoprazole (PROTONIX) 40 MG tablet Take 40 mg by mouth daily. For GERD    Historical Provider, MD  polyethylene glycol (MIRALAX / GLYCOLAX) packet Take 17 g by mouth daily.    Historical Provider, MD  Propylene Glycol (SYSTANE BALANCE OP) Apply 1 drop to eye 3 (three) times daily. Both eyes    Historical Provider, MD  sennosides-docusate sodium (SENOKOT-S) 8.6-50 MG tablet Take 1 tablet by mouth 2 (two) times daily. For constipation    Historical Provider, MD  sertraline (ZOLOFT) 25 MG tablet Take one tablet by mouth every morning for depression    Historical Provider, MD  sucralfate (CARAFATE) 1 G tablet Take 1 g by mouth 4 (four) times daily. Take one tablet by mouth twice daily for reflux    Historical Provider, MD  traZODone (DESYREL) 50 MG tablet Take 50 mg by mouth at bedtime.     Historical Provider, MD  vitamin B-12 (CYANOCOBALAMIN) 500 MCG tablet Take 500 mcg by mouth daily.    Historical Provider, MD  warfarin (COUMADIN) 2 MG tablet Take 2 mg by mouth daily. Give with 2.5 mg tablet to equal 4.5 mg daily    Historical Provider, MD  warfarin (COUMADIN) 2.5 MG tablet Take 2.5 mg by mouth daily.    Historical Provider, MD    Family History Family History  Problem Relation Age of Onset  . Cancer Father   . Cancer Mother   . Cancer Maternal Aunt   . Cancer Sister     Social History Social History  Substance Use Topics  . Smoking status: Never Smoker  . Smokeless tobacco: Never Used  . Alcohol use No   Allergies   Review of patient's allergies indicates no known  allergies.  Review of Systems Review of Systems  Constitutional: Negative for fever.  Respiratory: Positive for shortness of breath. Negative for wheezing.   Cardiovascular: Positive for leg swelling. Negative for chest pain and palpitations.  Gastrointestinal: Negative for abdominal pain, blood in stool, nausea and vomiting.  Genitourinary: Negative for dysuria.  Neurological: Negative for numbness.  Psychiatric/Behavioral: Negative for decreased concentration.  All other systems reviewed and are negative.  Physical Exam Updated Vital Signs BP 137/78 (BP Location: Right Arm)   Pulse (!) 121   Temp 98.2 F (36.8 C) (Oral)   Resp 23   SpO2 100%   Physical Exam  Constitutional: He appears well-developed and well-nourished. No distress.  HENT:  Head: Normocephalic.  Eyes: Pupils are equal, round, and reactive to light.  Neck: Normal range of motion.  Cardiovascular: Regular rhythm.   Tachycardic  Pulmonary/Chest: Effort normal. No respiratory distress.  Abdominal: Soft. He exhibits  no distension. There is no tenderness.  Musculoskeletal: He exhibits edema and tenderness. He exhibits no deformity.  Neurological: He is alert.  Left hemiparesis/weakness of upper and lower extremity.   Skin: Skin is warm. He is not diaphoretic.  Psychiatric: His behavior is normal.  Nursing note and vitals reviewed.  ED Treatments / Results  Labs (all labs ordered are listed, but only abnormal results are displayed) Labs Reviewed  CBC WITH DIFFERENTIAL/PLATELET - Abnormal; Notable for the following:       Result Value   WBC 14.3 (*)    RBC 2.61 (*)    Hemoglobin 6.5 (*)    HCT 21.1 (*)    MCH 24.9 (*)    RDW 15.8 (*)    Platelets 138 (*)    Neutro Abs 10.9 (*)    Monocytes Absolute 1.2 (*)    All other components within normal limits  BASIC METABOLIC PANEL - Abnormal; Notable for the following:    Sodium 131 (*)    Chloride 94 (*)    Glucose, Bld 162 (*)    Calcium 8.5 (*)    All  other components within normal limits  PROTIME-INR - Abnormal; Notable for the following:    Prothrombin Time 23.3 (*)    All other components within normal limits  I-STAT CG4 LACTIC ACID, ED - Abnormal; Notable for the following:    Lactic Acid, Venous 3.18 (*)    All other components within normal limits  TROPONIN I  BRAIN NATRIURETIC PEPTIDE  I-STAT CG4 LACTIC ACID, ED  TYPE AND SCREEN  PREPARE RBC (CROSSMATCH)    EKG  EKG Interpretation None      Radiology No results found.  Procedures Procedures (including critical care time)  Medications Ordered in ED Medications - No data to display   Initial Impression / Assessment and Plan / ED Course  I have reviewed the triage vital signs and the nursing notes.  Pertinent labs & imaging results that were available during my care of the patient were reviewed by me and considered in my medical decision making (see chart for details).  Clinical Course    Patient with PMH of CVA with left hemiparesis, DVT who takes coumadin who presents today with chief complaint of evaluation and treatment of DVT.   Patient completed US today and diagnosed with acute DVT.  Currently at SNF and on coumadin that has reportedly been therapeutic and taking medications as directed per nurse at [facility]. Nurse at SNF denies any other problems as does patient but he is here and tachycardic.   Hgb at 6.5 which is new.  After ROS, patient denies any other changes again but does state he has dark stool but that it is normal for him after iron supplementation.   Lactic acid at 3, fluids given.  Patient otherwise well appearing.   Patient ordered blood transfusion and admitted to step down unit for further management.   Final Clinical Impressions(s) / ED Diagnoses   Final diagnoses:  DVT (deep venous thrombosis) (HCC)  Anemia, unspecified type      Roberto Scales, MD 12/12/15 [place], MD 12/17/15 [phone]

## 2015-12-11 NOTE — ED Notes (Signed)
MD made aware of pt's lab results

## 2015-12-11 NOTE — ED Notes (Signed)
Dr.Gaddty  at bedside.

## 2015-12-11 NOTE — ED Notes (Signed)
RN wittnessed spouse sign blood consent form; It is believed that spouse mistakenly took form home when she left; RN left v/m for spouse to call back to confirm; RN will continue to search room for form; blood started as this RN wittnessed spouse sign

## 2015-12-11 NOTE — ED Notes (Signed)
Pt's spouse called to confirm with this RN that she mistakenly took Blood Consent form home; spouse will bring form back first thing in the AM; Pt is currently stable with no reactions noted after 15 min observation period.

## 2015-12-12 ENCOUNTER — Encounter (HOSPITAL_COMMUNITY): Payer: Self-pay | Admitting: Internal Medicine

## 2015-12-12 DIAGNOSIS — D62 Acute posthemorrhagic anemia: Principal | ICD-10-CM

## 2015-12-12 DIAGNOSIS — I824Z3 Acute embolism and thrombosis of unspecified deep veins of distal lower extremity, bilateral: Secondary | ICD-10-CM

## 2015-12-12 DIAGNOSIS — D649 Anemia, unspecified: Secondary | ICD-10-CM

## 2015-12-12 DIAGNOSIS — D696 Thrombocytopenia, unspecified: Secondary | ICD-10-CM

## 2015-12-12 DIAGNOSIS — K922 Gastrointestinal hemorrhage, unspecified: Secondary | ICD-10-CM | POA: Diagnosis present

## 2015-12-12 DIAGNOSIS — R6510 Systemic inflammatory response syndrome (SIRS) of non-infectious origin without acute organ dysfunction: Secondary | ICD-10-CM

## 2015-12-12 DIAGNOSIS — I82402 Acute embolism and thrombosis of unspecified deep veins of left lower extremity: Secondary | ICD-10-CM

## 2015-12-12 DIAGNOSIS — I82409 Acute embolism and thrombosis of unspecified deep veins of unspecified lower extremity: Secondary | ICD-10-CM

## 2015-12-12 DIAGNOSIS — I6930 Unspecified sequelae of cerebral infarction: Secondary | ICD-10-CM

## 2015-12-12 HISTORY — DX: Anemia, unspecified: D64.9

## 2015-12-12 LAB — CBC
HCT: 20.2 % — ABNORMAL LOW (ref 39.0–52.0)
HEMATOCRIT: 25.5 % — AB (ref 39.0–52.0)
HEMOGLOBIN: 8.3 g/dL — AB (ref 13.0–17.0)
Hemoglobin: 6.6 g/dL — CL (ref 13.0–17.0)
MCH: 26.4 pg (ref 26.0–34.0)
MCH: 26.7 pg (ref 26.0–34.0)
MCHC: 32.7 g/dL (ref 30.0–36.0)
MCHC: 32.5 g/dL (ref 30.0–36.0)
MCV: 80.8 fL (ref 78.0–100.0)
MCV: 82 fL (ref 78.0–100.0)
PLATELETS: 111 10*3/uL — AB (ref 150–400)
Platelets: 111 10*3/uL — ABNORMAL LOW (ref 150–400)
RBC: 2.5 MIL/uL — AB (ref 4.22–5.81)
RBC: 3.11 MIL/uL — ABNORMAL LOW (ref 4.22–5.81)
RDW: 15.8 % — ABNORMAL HIGH (ref 11.5–15.5)
RDW: 16 % — AB (ref 11.5–15.5)
WBC: 15.2 10*3/uL — AB (ref 4.0–10.5)
WBC: 19.3 10*3/uL — AB (ref 4.0–10.5)

## 2015-12-12 LAB — URINALYSIS, ROUTINE W REFLEX MICROSCOPIC
Bilirubin Urine: NEGATIVE
GLUCOSE, UA: NEGATIVE mg/dL
Hgb urine dipstick: NEGATIVE
Ketones, ur: NEGATIVE mg/dL
LEUKOCYTES UA: NEGATIVE
Nitrite: NEGATIVE
PH: 5.5 (ref 5.0–8.0)
Protein, ur: NEGATIVE mg/dL
SPECIFIC GRAVITY, URINE: 1.04 — AB (ref 1.005–1.030)

## 2015-12-12 LAB — COMPREHENSIVE METABOLIC PANEL
ALBUMIN: 2.6 g/dL — AB (ref 3.5–5.0)
ALT: 18 U/L (ref 17–63)
ANION GAP: 9 (ref 5–15)
AST: 23 U/L (ref 15–41)
Alkaline Phosphatase: 101 U/L (ref 38–126)
BILIRUBIN TOTAL: 1.2 mg/dL (ref 0.3–1.2)
BUN: 10 mg/dL (ref 6–20)
CHLORIDE: 101 mmol/L (ref 101–111)
CO2: 23 mmol/L (ref 22–32)
Calcium: 7.8 mg/dL — ABNORMAL LOW (ref 8.9–10.3)
Creatinine, Ser: 0.72 mg/dL (ref 0.61–1.24)
GFR calc Af Amer: >60 mL/min (ref >60)
GFR calc non Af Amer: >60 mL/min (ref >60)
GLUCOSE: 122 mg/dL — AB (ref 65–99)
POTASSIUM: 3.5 mmol/L (ref 3.5–5.1)
SODIUM: 133 mmol/L — AB (ref 135–145)
TOTAL PROTEIN: 5.1 g/dL — AB (ref 6.5–8.1)

## 2015-12-12 LAB — PREPARE RBC (CROSSMATCH)

## 2015-12-12 LAB — INFLUENZA PANEL BY PCR (TYPE A & B)
H1N1FLUPCR: NOT DETECTED
Influenza A By PCR: NEGATIVE
Influenza B By PCR: NEGATIVE

## 2015-12-12 LAB — LACTIC ACID, PLASMA
Lactic Acid, Venous: 2.2 mmol/L (ref 0.5–1.9)
Lactic Acid, Venous: 1.2 mmol/L (ref 0.5–1.9)

## 2015-12-12 LAB — MRSA PCR SCREENING: MRSA BY PCR: NEGATIVE

## 2015-12-12 MED ORDER — PANTOPRAZOLE SODIUM 40 MG IV SOLR
80.0000 mg | Freq: Once | INTRAVENOUS | Status: AC
  Administered 2015-12-12: 80 mg via INTRAVENOUS
  Filled 2015-12-12: qty 80

## 2015-12-12 MED ORDER — TRAZODONE HCL 50 MG PO TABS
50.0000 mg | ORAL_TABLET | Freq: Every day | ORAL | Status: DC
  Administered 2015-12-12 (×2): 50 mg via ORAL
  Filled 2015-12-12 (×2): qty 1

## 2015-12-12 MED ORDER — SODIUM CHLORIDE 0.9 % IV SOLN
8.0000 mg/h | INTRAVENOUS | Status: DC
  Administered 2015-12-12 – 2015-12-13 (×2): 8 mg/h via INTRAVENOUS
  Filled 2015-12-12 (×7): qty 80

## 2015-12-12 MED ORDER — PIPERACILLIN-TAZOBACTAM 3.375 G IVPB
3.3750 g | Freq: Three times a day (TID) | INTRAVENOUS | Status: DC
  Administered 2015-12-12 – 2015-12-13 (×3): 3.375 g via INTRAVENOUS
  Filled 2015-12-12 (×5): qty 50

## 2015-12-12 MED ORDER — SODIUM CHLORIDE 0.9 % IV SOLN
INTRAVENOUS | Status: DC
  Administered 2015-12-12 (×2): via INTRAVENOUS

## 2015-12-12 MED ORDER — PIPERACILLIN-TAZOBACTAM 3.375 G IVPB
3.3750 g | Freq: Three times a day (TID) | INTRAVENOUS | Status: DC
  Administered 2015-12-12: 3.375 g via INTRAVENOUS
  Filled 2015-12-12 (×3): qty 50

## 2015-12-12 MED ORDER — SODIUM CHLORIDE 0.9 % IV SOLN: Freq: Once | INTRAVENOUS | Status: DC

## 2015-12-12 MED ORDER — ATORVASTATIN CALCIUM 10 MG PO TABS
10.0000 mg | ORAL_TABLET | Freq: Every day | ORAL | Status: DC
  Administered 2015-12-12 (×2): 10 mg via ORAL
  Filled 2015-12-12 (×2): qty 1

## 2015-12-12 MED ORDER — ACETAMINOPHEN 325 MG PO TABS
650.0000 mg | ORAL_TABLET | Freq: Four times a day (QID) | ORAL | Status: DC | PRN
  Administered 2015-12-12 (×2): 650 mg via ORAL
  Filled 2015-12-12 (×2): qty 2

## 2015-12-12 MED ORDER — BACLOFEN 10 MG PO TABS
10.0000 mg | ORAL_TABLET | Freq: Two times a day (BID) | ORAL | Status: DC
  Administered 2015-12-12 – 2015-12-13 (×3): 10 mg via ORAL
  Filled 2015-12-12 (×3): qty 1

## 2015-12-12 MED ORDER — ONDANSETRON HCL 4 MG PO TABS: 4.0000 mg | ORAL_TABLET | Freq: Four times a day (QID) | ORAL | Status: DC | PRN

## 2015-12-12 MED ORDER — ONDANSETRON HCL 4 MG/2ML IJ SOLN: 4.0000 mg | Freq: Four times a day (QID) | INTRAMUSCULAR | Status: DC | PRN

## 2015-12-12 MED ORDER — PANTOPRAZOLE SODIUM 40 MG IV SOLR: 40.0000 mg | Freq: Two times a day (BID) | INTRAVENOUS | Status: DC

## 2015-12-12 MED ORDER — SERTRALINE HCL 50 MG PO TABS
25.0000 mg | ORAL_TABLET | Freq: Every day | ORAL | Status: DC
  Administered 2015-12-13: 25 mg via ORAL
  Filled 2015-12-12: qty 1

## 2015-12-12 MED ORDER — IPRATROPIUM-ALBUTEROL 0.5-2.5 (3) MG/3ML IN SOLN: 3.0000 mL | Freq: Three times a day (TID) | RESPIRATORY_TRACT | Status: DC | PRN

## 2015-12-12 MED ORDER — VANCOMYCIN HCL 10 G IV SOLR
1250.0000 mg | Freq: Two times a day (BID) | INTRAVENOUS | Status: DC
  Administered 2015-12-12 – 2015-12-13 (×3): 1250 mg via INTRAVENOUS
  Filled 2015-12-12 (×4): qty 1250

## 2015-12-12 MED ORDER — SODIUM CHLORIDE 0.9 % IV BOLUS (SEPSIS)
1000.0000 mL | Freq: Once | INTRAVENOUS | Status: AC
  Administered 2015-12-12: 1000 mL via INTRAVENOUS

## 2015-12-12 MED ORDER — ACETAMINOPHEN 650 MG RE SUPP: 650.0000 mg | Freq: Four times a day (QID) | RECTAL | Status: DC | PRN

## 2015-12-12 NOTE — Clinical Social Work Note (Signed)
Clinical Social Work Assessment  Patient Details  Name: Tyler Beard MRN: [id number] Date of Birth: 01/24/1935  Date of referral:  12/12/15               Reason for consult:  Facility Placement, End of Life/Hospice                Permission sought to share information with:  Family Supports Permission granted to share information::  Yes, Verbal Permission Granted  Name::     Tyler Beard  Relationship::  Spouse  Contact Information:  [phone]  Housing/Transportation Living arrangements for the past 2 months:  Skilled Nursing Facility Source of Information:  Patient, Adult Children, Spouse Patient Interpreter Needed:  None Criminal Activity/Legal Involvement Pertinent to Current Situation/Hospitalization:  No - Comment as needed Significant Relationships:  Adult Children, Spouse Lives with:  Facility Resident Do you feel safe going back to the place where you live?  Yes Need for family participation in patient care:  Yes (Comment)  Care giving concerns:  Patient wife and daughter aware of the possibility of no bed availability at [facility], however due to patient wife history with [facility], she feels certain it will work out.   Social Worker assessment / plan:  Clinical Social Worker met with patient and patient family at bedside to offer support and discuss patient needs at discharge.  Patient wife and daughter both state that patient has been a resident at [facility] for about 3 years and is now ready to transition to residential hospice.  Patient wife has a past with [facility] and is hopeful for patient placement at discharge.  CSW made referral and facility has accepted patient for admission on 10/28.  Patient spouse has completed paperwork for [facility] and aware of discharge plans for tomorrow.  CSW remains available for support and to facilitate patient discharge needs.  Employment status:  Disabled (Comment on whether or not currently receiving  Disability) Insurance information:  Managed Medicare PT Recommendations:  Not assessed at this time Information / Referral to community resources:  Other (Comment Required) (Residential Hospice)  Patient/Family's Response to care:  Patient and patient family agreeable with current discharge plan and aware of CSW role in patient care.  Patient family appreciative for support and ready for discharge.  Patient/Family's Understanding of and Emotional Response to Diagnosis, Current Treatment, and Prognosis:  Patient and patient family aware of patient poor prognosis and agreeable with transition to residential hospice.  Patient family is appropriately adjusting and supporting one another.  Emotional Assessment Appearance:  Appears stated age Attitude/Demeanor/Rapport:  Inconsistent Affect (typically observed):  Accepting, Appropriate, Calm Orientation:  Oriented to Self, Oriented to Situation, Oriented to Place, Oriented to  Time Alcohol / Substance use:  Not Applicable Psych involvement (Current and /or in the community):  No (Comment)  Discharge Needs  Concerns to be addressed:  Discharge Planning Concerns, Grief and Loss Concerns Readmission within the last 30 days:  No Current discharge risk:  Dependent with Mobility, Terminally ill Barriers to Discharge:  Continued Medical Work up, Hospice Bed not available  Jesse Scinto, LCSW [phone]  

## 2015-12-12 NOTE — Progress Notes (Signed)
Pharmacy Antibiotic Note  Tyler Beard is a 81 y.o. male admitted on 12/11/2015 with sepsis.  Pharmacy has been consulted for Vancomycin/Zosyn dosing. WBC mildly elevated. Renal function good. Pt febrile up to 101. Source unclear.  Plan: -Vancomycin 1250 mg IV q12h -Zosyn 3.375G IV q8h to be infused over 4 hours -Trend WBC, temp, renal function  -Drug levels as indicated   Weight: 211 lb 11.2 oz (96 kg)  Temp (24hrs), Avg:99.6 F (37.6 C), Min:98.2 F (36.8 C), Max:101 F (38.3 C)   Recent Labs Lab 12/11/15 1642 12/11/15 1735 12/11/15 2037  WBC  --  14.3*  --   CREATININE  --  0.83  --   LATICACIDVEN 3.18*  --  1.54    Estimated Creatinine Clearance: 81 mL/min (by C-G formula based on SCr of 0.83 mg/dL).    No Known Allergies   Tyler Beard 12/12/2015 12:48 AM

## 2015-12-12 NOTE — Discharge Summary (Signed)
Physician Discharge Summary  Tyler Beard [id number] DOB: [birth year] DOA: 12/11/2015  PCP: Blanchie Serve, MD  Admit date: 12/11/2015 Discharge date: 12/13/15  [facility] Disposition:  [facility]Residential Hospice    Discharge Condition:stable CODE STATUS: comfort care Diet recommendation: Regular   Brief/Interim Summary: 81 year old male with a history of stroke with left hemiplegia, right lower extremity DVT on warfarin, pulmonary embolus with history of IVC filter, hypertension, hyperlipidemia presented with increasing left lower extremity edema and pain. Venous Doppler at outside facility was obtained and revealed extensive DVT in the common femoral vein, superficial femoral vein, popliteal, and peroneal veins. The patient was sent to the emergency department from [facility] for further workup. Routine blood work revealed in the emergency department hemoglobin of 6.5. The patient states that he has noted "dark stools", but denies any hematochezia or hematemesis. The patient attributes his dark stool to taking iron. Other than his lower extremity pain, the patient has been essentially asymptomatic.  Patient denies fevers, chills, headache, chest pain, dyspnea, nausea, vomiting, diarrhea, abdominal pain, dysuria, hematuria, hematochezia. After admission, hematology, vascular surgery, and gastroenterology were consulted to assist with management. 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 Deatra Ina. 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  In addition, the patient has had a previous history of PE and DVT for which he had an IVC filter placed in June 2007. The patient's INR was therapeutic at 2.04 at the time of admission.  Dr. Alvy Bimler saw the pat had a long discussion with the patient and family regarding his poor prognosis given his multiple  liver masses and history of duodenal mass in the setting of blood loss anemia and hypercoagulable state. After their discussion , the patient and family agreed to transition the patient's care to focus on full comfort. As such, GI consult was canceled.   Discharge Diagnoses:  Acute blood loss anemia/Melena -Transfused 2 additional units PRBC-->recheck CBC -consulted  [facility] -trend Hgb -Rectal by EDP-->melena without hematochezia  -continue protonix drip for now pending transfer to residential hospice -Dr. Alvy Bimler saw the pat had a long discussion with the patient and family regarding his poor prognosis given his multiple liver masses and history of duodenal mass in the setting of blood loss anemia and hypercoagulable state. After their discussion , the patient and family agreed to transition the patient's care to focus on full comfort.  Acute LLE DVT superimposed upon hx of RLE DVT and PE -outside facility duplex--thrombus in left common femoral vein, superficial femoral vein, popliteal, and peroneal veins -INR 2.04 at time of admission -concerned about cool left foot, possible ischemia-->consulted vascular surgery -consulted Hematology--discussed with Dr. Dory Larsen, consult palliative medicine, will see patient -d/c warfarin -Dr. Alvy Bimler saw the pat had a long discussion with the patient and family regarding his poor prognosis given his multiple liver masses and history of duodenal mass in the setting of blood loss anemia and hypercoagulable state. After their discussion , the patient and family agreed to transition the patient's care to focus on full comfort.  Fever/SIRS -CT chest neg for PE, no infiltrates, consolidations -UA--no pyuria -follow blood culture -MRSA screen -empiric vanc and zosyn pending culture data -likely due to extensive DVT and possible ischemic foot  Tubulovillous adenoma -not a candidate for resection  Thrombocytopenia -due to consumption from new  DVT in setting of warfarin use -trend -no longer active issue as pt's  focus of care is transitioned to that focused on comfort  History of stroke -residual Left hemiplegia -wheelchair bound  Hyponatremia -likely volume depletion -improving with fluid resuscitation    Discharge Instructions     Medication List    STOP taking these medications   atorvastatin 10 MG tablet Commonly known as:  LIPITOR   calcium carbonate 1250 (500 Ca) MG tablet Commonly known as:  OS-CAL - dosed in mg of elemental calcium   ferrous sulfate 325 (65 FE) MG tablet   GENERLAC 10 GM/15ML Soln Generic drug:  lactulose (encephalopathy)   linaclotide 145 MCG Caps capsule Commonly known as:  LINZESS   loratadine 10 MG tablet Commonly known as:  CLARITIN   Melatonin 5 MG Tabs   pantoprazole 40 MG tablet Commonly known as:  PROTONIX   polyethylene glycol packet Commonly known as:  MIRALAX / GLYCOLAX   sennosides-docusate sodium 8.6-50 MG tablet Commonly known as:  SENOKOT-S   sertraline 25 MG tablet Commonly known as:  ZOLOFT   sucralfate 1 g tablet Commonly known as:  CARAFATE   traZODone 50 MG tablet Commonly known as:  DESYREL   vitamin B-12 500 MCG tablet Commonly known as:  CYANOCOBALAMIN   Vitamin D 2000 units Caps   warfarin 2 MG tablet Commonly known as:  COUMADIN   warfarin 2.5 MG tablet Commonly known as:  COUMADIN     TAKE these medications   baclofen 10 MG tablet Commonly known as:  LIORESAL Take 10 mg by mouth 2 (two) times daily. For Spastic Hemiparesis   ipratropium-albuterol 0.5-2.5 (3) MG/3ML Soln Commonly known as:  DUONEB Take 3 mLs by nebulization every 8 (eight) hours as needed (for wheezing ot shortness of breath).   morphine CONCENTRATE 10 MG/0.5ML Soln concentrated solution Take 0.2 mLs (4 mg total) by mouth every 2 (two) hours as needed for moderate pain, severe pain or shortness of breath.   SYSTANE BALANCE OP Place 1 drop into both eyes  3 (three) times daily. Both eyes       No Known Allergies  Consultations:  VVS--Dr. Early  Hematology--Dr. Alvy Bimler    Procedures/Studies: Ct Angio Chest Pe W Or Wo Contrast  Result Date: 12/11/2015 CLINICAL DATA:  Chest pain.  No history of cancer hypertension. EXAM: CT ANGIOGRAPHY CHEST WITH CONTRAST TECHNIQUE: Multidetector CT imaging of the chest was performed using the standard protocol during bolus administration of intravenous contrast. Multiplanar CT image reconstructions and MIPs were obtained to evaluate the vascular anatomy. CONTRAST:  80 cc IV Isovue 370 COMPARISON:  09/25/2009 chest CT FINDINGS: Cardiovascular: Top normal size cardiac chambers without pericardial effusion or thickening. Coronary arteriosclerosis noted. No aortic aneurysm or dissection. No large central pulmonary embolus third order branches. Beyond this, study is limited for tiny pulmonary emboli due to motion artifacts. Mediastinum/Nodes: No enlarged mediastinal, hilar, or axillary lymph nodes. Thyroid gland, trachea, and esophagus demonstrate no significant findings. A moderate-sized hiatal hernia is present. Lungs/Pleura: No pleural effusion. Minimal bibasilar atelectasis. Small pulmonary blebs in the right upper lobe. No pneumothorax or pulmonary consolidations. Upper Abdomen: In homogeneous masslike abnormalities of the visualized liver concerning for neoplasm. Parapelvic left renal cysts. Possible mild left hydroureter versus extra renal pelvis incompletely imaged. Moderate to marked amount of fecal residue within the visualized large bowel. Musculoskeletal: No acute osseous abnormality. Review of the MIP images confirms the above findings. IMPRESSION: No large central pulmonary embolus to the third order branches. Beyond this, assessment is limited by respiratory motion artifacts. Inhomogeneous masslike abnormalities  of the visualized liver concerning for neoplasm possibly metastatic. Electronically Signed   By:  Ashley Royalty M.D.   On: 12/11/2015 19:39        Discharge Exam: Vitals:   12/13/15 0337 12/13/15 0825  BP: (!) 120/92 115/77  Pulse: (!) 109 (!) 121  Resp: 18 16  Temp: 99.5 F (37.5 C) 99.4 F (37.4 C)   Vitals:   12/12/15 2145 12/12/15 2351 12/13/15 0337 12/13/15 0825  BP: (!) 147/65 110/79 (!) 120/92 115/77  Pulse: (!) 123 (!) 113 (!) 109 (!) 121  Resp: 16 16 18 16   Temp: 100.2 F (37.9 C) 99.1 F (37.3 C) 99.5 F (37.5 C) 99.4 F (37.4 C)  TempSrc: Oral Oral Oral Oral  SpO2: 99% 100% 99% 99%  Weight:   103 kg (227 lb 1.6 oz)     General: Pt is alert, awake, not in acute distress Cardiovascular: RRR, S1/S2 +, no rubs, no gallops Respiratory: CTA bilaterally, no wheezing, no rhonchi Abdominal: Soft, NT, ND, bowel sounds + Extremities: no edema, no cyanosis   The results of significant diagnostics from this hospitalization (including imaging, microbiology, ancillary and laboratory) are listed below for reference.    Significant Diagnostic Studies: Ct Angio Chest Pe W Or Wo Contrast  Result Date: 12/11/2015 CLINICAL DATA:  Chest pain.  No history of cancer hypertension. EXAM: CT ANGIOGRAPHY CHEST WITH CONTRAST TECHNIQUE: Multidetector CT imaging of the chest was performed using the standard protocol during bolus administration of intravenous contrast. Multiplanar CT image reconstructions and MIPs were obtained to evaluate the vascular anatomy. CONTRAST:  80 cc IV Isovue 370 COMPARISON:  09/25/2009 chest CT FINDINGS: Cardiovascular: Top normal size cardiac chambers without pericardial effusion or thickening. Coronary arteriosclerosis noted. No aortic aneurysm or dissection. No large central pulmonary embolus third order branches. Beyond this, study is limited for tiny pulmonary emboli due to motion artifacts. Mediastinum/Nodes: No enlarged mediastinal, hilar, or axillary lymph nodes. Thyroid gland, trachea, and esophagus demonstrate no significant findings. A moderate-sized  hiatal hernia is present. Lungs/Pleura: No pleural effusion. Minimal bibasilar atelectasis. Small pulmonary blebs in the right upper lobe. No pneumothorax or pulmonary consolidations. Upper Abdomen: In homogeneous masslike abnormalities of the visualized liver concerning for neoplasm. Parapelvic left renal cysts. Possible mild left hydroureter versus extra renal pelvis incompletely imaged. Moderate to marked amount of fecal residue within the visualized large bowel. Musculoskeletal: No acute osseous abnormality. Review of the MIP images confirms the above findings. IMPRESSION: No large central pulmonary embolus to the third order branches. Beyond this, assessment is limited by respiratory motion artifacts. Inhomogeneous masslike abnormalities of the visualized liver concerning for neoplasm possibly metastatic. Electronically Signed   By: Ashley Royalty M.D.   On: 12/11/2015 19:39     Microbiology: Recent Results (from the past 240 hour(s))  MRSA PCR Screening     Status: None   Collection Time: 12/12/15 12:04 AM  Result Value Ref Range Status   MRSA by PCR NEGATIVE NEGATIVE Final    Comment:        The GeneXpert MRSA Assay (FDA approved for NASAL specimens only), is one component of a comprehensive MRSA colonization surveillance program. It is not intended to diagnose MRSA infection nor to guide or monitor treatment for MRSA infections.   Culture, Urine     Status: None   Collection Time: 12/12/15  8:05 AM  Result Value Ref Range Status   Specimen Description URINE, RANDOM  Final   Special Requests NONE  Final   Culture  NO GROWTH  Final   Report Status 12/13/2015 FINAL  Final     Labs: Basic Metabolic Panel:  Recent Labs Lab 12/11/15 1735 12/12/15 0304  NA 131* 133*  K 3.6 3.5  CL 94* 101  CO2 26 23  GLUCOSE 162* 122*  BUN 13 10  CREATININE 0.83 0.72  CALCIUM 8.5* 7.8*   Liver Function Tests:  Recent Labs Lab 12/12/15 0304  AST 23  ALT 18  ALKPHOS 101  BILITOT 1.2    PROT 5.1*  ALBUMIN 2.6*   No results for input(s): LIPASE, AMYLASE in the last 168 hours. No results for input(s): AMMONIA in the last 168 hours. CBC:  Recent Labs Lab 12/11/15 1735 12/12/15 0304 12/12/15 1131  WBC 14.3* 15.2* 19.3*  NEUTROABS 10.9*  --   --   HGB 6.5* 6.6* 8.3*  HCT 21.1* 20.2* 25.5*  MCV 80.8 80.8 82.0  PLT 138* 111* 111*   Cardiac Enzymes:  Recent Labs Lab 12/11/15 1735  TROPONINI <0.03   BNP: Invalid input(s): POCBNP CBG: No results for input(s): GLUCAP in the last 168 hours.  Time coordinating discharge:  Greater than 30 minutes  Signed:  Kendrix Orman, DO [facility] Pager: [phone] 12/13/2015, 10:40 AM

## 2015-12-12 NOTE — Consult Note (Signed)
[facility] [employer]  Received request from [place] for family interest in [facility]. Chart reviewed and met with patient's spouse Thayer Headings in sitting room to complete paperwork for transfer to [facility] tomorrow 12/13/15. CSW and bedside RN aware. Note per Dr. Calton Dach note patient's code status is now DNR. Confirmed with spouse and made bedside RN aware.   Please fax discharge summary to [phone].  RN please call report to [phone].  Thank you for helping get Mr.Crompton to [facility] Saturday morning.  Erling Conte, [place]

## 2015-12-12 NOTE — Consult Note (Signed)
[facility] NOTE   MRN : [id number]  Reason for Consult: Positive acute Left LE DVT and with right DVT x 1 month  with IVC filter and coumadin on board Referring Physician: Dr. Shanon Brow Tat  History of Present Illness: 81 y/o male with left LE DVT. Symptoms started on 9/25 with a complaint of right LE pain x 1 week.   On 12/11/2015 he was brought to [facility] with  tachycardic for which patient had a CT angiogram done which was negative for PE. Hemoglobin was found to be around 6 and as per the ER physician stool for occult was positive but not melanotic.  He does have a history of PE  Date unknown.   Past medical history : History of CVA with right-sided hemiplegia - on statin. Cannot give antiplatelet agents due to GI bleed, hyperlipidemia managed with Lipitor.  Coumadin currently on hold secondary to GI bleed work up and anemia.     Current Facility-Administered Medications  Medication Dose Route Frequency Provider Last Rate Last Dose  . 0.9 %  sodium chloride infusion   Intravenous Once Rise Patience, MD      . 0.9 %  sodium chloride infusion   Intravenous Continuous Rise Patience, MD 75 mL/hr at 12/12/15 [phone]    . acetaminophen (TYLENOL) tablet 650 mg  650 mg Oral Q6H PRN Rise Patience, MD   650 mg at 12/12/15 0105   Or  . acetaminophen (TYLENOL) suppository 650 mg  650 mg Rectal Q6H PRN Rise Patience, MD      . atorvastatin (LIPITOR) tablet 10 mg  10 mg Oral QHS Rise Patience, MD   10 mg at 12/12/15 0105  . baclofen (LIORESAL) tablet 10 mg  10 mg Oral BID Rise Patience, MD   10 mg at 12/12/15 0105  . ipratropium-albuterol (DUONEB) 0.5-2.5 (3) MG/3ML nebulizer solution 3 mL  3 mL Nebulization Q8H PRN Rise Patience, MD      . ondansetron [facility]) tablet 4 mg  4 mg Oral Q6H PRN Rise Patience, MD       Or  . ondansetron [facility]) injection 4 mg  4 mg Intravenous Q6H PRN Rise Patience, MD       . pantoprazole (PROTONIX) 80 mg in sodium chloride 0.9 % 250 mL (0.32 mg/mL) infusion  8 mg/hr Intravenous Continuous Rise Patience, MD 25 mL/hr at 12/12/15 0752 8 mg/hr at 12/12/15 0752  . [START ON 12/15/2015] pantoprazole (PROTONIX) injection 40 mg  40 mg Intravenous Q12H Rise Patience, MD      . piperacillin-tazobactam (ZOSYN) IVPB 3.375 g  3.375 g Intravenous Q8H David Tat, MD      . sertraline (ZOLOFT) tablet 25 mg  25 mg Oral Daily Rise Patience, MD      . traZODone (DESYREL) tablet 50 mg  50 mg Oral QHS Rise Patience, MD   50 mg at 12/12/15 0105  . vancomycin (VANCOCIN) 1,250 mg in sodium chloride 0.9 % 250 mL IVPB  1,250 mg Intravenous Q12H Erenest Blank, RPH   1,250 mg at 12/12/15 0126    Pt meds include: Statin :Yes Betablocker: No ASA: No Other anticoagulants/antiplatelets: hold coumadin  Past Medical History:  Diagnosis Date  . Anemia 12/12/2015  . Cataracts, bilateral   . Cerebrovascular accident ([place]) 2007  . Depression   . DVT (deep venous thrombosis) ([place]) 11/2015  . GERD (gastroesophageal reflux disease)   .  Glaucoma   . Hypercholesterolemia   . Hypertension     Past Surgical History:  Procedure Laterality Date  . aphagia    . cataract surgery     bilateral  . ESOPHAGOGASTRODUODENOSCOPY  01/10/2012   Procedure: ESOPHAGOGASTRODUODENOSCOPY (EGD);  Surgeon: Inda Castle, MD;  Location: [facility];  Service: Endoscopy;  Laterality: N/A;  . IVC FILTER PLACEMENT ([place] HX)    . Section of villous adenoma of colon      Social History Social History  Substance Use Topics  . Smoking status: Never Smoker  . Smokeless tobacco: Never Used  . Alcohol use No    Family History Family History  Problem Relation Age of Onset  . Cancer Father   . Cancer Mother   . Cancer Maternal Aunt   . Cancer Sister     No Known Allergies   REVIEW OF SYSTEMS  General: [ ]  Weight loss, [ ]  Fever, [ ]  chills Neurologic: [ ]  Dizziness, [ ]   Blackouts, [ ]  Seizure [ x] Stroke, [ ]  "Mini stroke", [ ]  Slurred speech, [ ]  Temporary blindness; [ ]  weakness in arms or legs, [ ]  Hoarseness [ ]  Dysphagia Cardiac: [ ]  Chest pain/pressure, [x ] Shortness of breath at rest [ ]  Shortness of breath with exertion, [ ]  Atrial fibrillation or irregular heartbeat  Vascular: [ ]  Pain in legs with walking, [ ]  Pain in legs at rest, [ ]  Pain in legs at night,  [ ]  Non-healing ulcer, [x ] Blood clot in vein/DVT,   Pulmonary: [ ]  Home oxygen, [ ]  Productive cough, [ ]  Coughing up blood, [ ]  Asthma,  [ ]  Wheezing [ ]  COPD Musculoskeletal:  [ ]  Arthritis, [ ]  Low back pain, [ ]  Joint pain Hematologic: [x ] Easy Bruising, [x ] Anemia; [ ]  Hepatitis Gastrointestinal: [ ]  Blood in stool, [ ]  Gastroesophageal Reflux/heartburn, Urinary: [ ]  chronic Kidney disease, [ ]  on HD - [ ]  MWF or [ ]  TTHS, [ ]  Burning with urination, [ ]  Difficulty urinating Skin: [ ]  Rashes, [ ]  Wounds Psychological: [ ]  Anxiety, [ ]  Depression  Physical Examination Vitals:   12/12/15 0110 12/12/15 0403 12/12/15 0430 12/12/15 0741  BP: 140/86 132/84 126/73 (!) 160/86  Pulse: (!) 115 (!) 115  (!) 114  Resp: 20 16 16 16   Temp: (!) 100.5 F (38.1 C) (!) 100.6 F (38.1 C) 100 F (37.8 C) 99.4 F (37.4 C)  TempSrc: Oral Oral Oral Oral  SpO2:  99%  99%  Weight:  216 lb 14.4 oz (98.4 kg)     Body mass index is 32.98 kg/m.  General:  WDWN in NAD Gait: left hemiplegia  HENT: WNL Eyes: Pupils equal Pulmonary: normal non-labored breathing , without Rales, rhonchi,  wheezing Cardiac: RRR, without  Murmurs, rubs or gallops; Abdomen: soft, NT, no masses Skin: no rashes, ulcers noted;  no Gangrene , no cellulitis; no open wounds; Positive ecchymosis and dusky toes left foot  Vascular Exam/Pulses:Palpable DP Bilaterally and doppler PT biphasic signal bilaterally   Musculoskeletal: no muscle wasting or atrophy; moderate edema Left > right  Neurologic: A&O X 3; Appropriate  Affect ;  SENSATION: normal; MOTOR FUNCTION: left hemiplegia  Speech is fluent/normal   Significant Diagnostic Studies: CBC Lab Results  Component Value Date   WBC 15.2 (H) 12/12/2015   HGB 6.6 (LL) 12/12/2015   HCT 20.2 (L) 12/12/2015   MCV 80.8 12/12/2015   PLT 111 (L) 12/12/2015    BMET  Component Value Date/Time   NA 133 (L) 12/12/2015 0304   NA 137 11/11/2015   K 3.5 12/12/2015 0304   CL 101 12/12/2015 0304   CO2 23 12/12/2015 0304   GLUCOSE 122 (H) 12/12/2015 0304   BUN 10 12/12/2015 0304   BUN 9 11/11/2015   CREATININE 0.72 12/12/2015 0304   CALCIUM 7.8 (L) 12/12/2015 0304   GFRNONAA >60 12/12/2015 0304   GFRAA >60 12/12/2015 0304   Estimated Creatinine Clearance: 85.1 mL/min (by C-G formula based on SCr of 0.72 mg/dL).  COAG Lab Results  Component Value Date   INR 2.04 12/11/2015   INR 1.1 08/14/2008   INR 1.0 01/19/2007     Non-Invasive Vascular Imaging:  Reported DVT bilateral LE  ASSESSMENT/PLAN:  Patient has reported Liver cancer Multiple DVT and history of PE with filter and coumadin chronic treatment  He is not a candidate for thrombolysis due to his current diagnosis of cancer.  He is not a surgical candidate.   We recommend continued anticoagulation and elevation of bilateral LE   Laurence Slate [facility] 12/12/2015 9:51 AM  I have examined the patient, reviewed and agree with above.Curt Jews, MD 12/12/2015 11:24 AM Extensive bilateral DVT setting of chronic DVT. Most likely related to hypercoagulability associated with metastatic malignancy to liver. Are swelling bilaterally. I does have some duskiness in his left foot and toes. Bounding pedal pulses on the right and 2+ anterior tibial pulse just above the ankle on the left with biphasic Doppler signals. Agree with palliation only. Explained to the family no surgical role and that is not a candidate for lysis due to bleeding. Will not follow actively. Please call if we can assist

## 2015-12-12 NOTE — Consult Note (Signed)
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 male is admitted to the hospital from nursing home after presentation with significant bilateral lower extremity edema. The patient have history of stroke with hemiplegia, history of DVT and PE status post IVC filter placement. In 2013, he was noted to have abnormal GI mass but was not deemed a candidate for surgery. He have chronic melanotic stool on chronic anticoagulation therapy. With significant bilateral lower extremity edema, he was admitted for further evaluation. He was also noted to have significant anemia with mild thrombocytopenia on admission, received blood transfusion In addition, blood work also shows significant protein calorie malnutrition CT scan angiogram excluded PE but showed evidence of liver mass The patient has poor baseline performance status at the nursing home and require round-the-clock care by caregivers He was prescribed oral iron supplement due to anemia He denies dysphagia. No recent cough. No recent fever or chills. He denies pain His wife and daughter are present and his wife has been notified about diagnosis of cancer several years ago and at that time he was only given 1 year survival but have exceeded that, obviously  MEDICAL HISTORY:  Past Medical History:  Diagnosis Date  . Anemia 12/12/2015  . Cataracts, bilateral   . Cerebrovascular accident ([place]) 2007  . Depression   . DVT (deep venous thrombosis) ([place]) 11/2015  . GERD (gastroesophageal reflux disease)   . Glaucoma   . Hypercholesterolemia   . Hypertension     SURGICAL HISTORY: Past Surgical History:  Procedure Laterality Date  . aphagia    . cataract  surgery     bilateral  . ESOPHAGOGASTRODUODENOSCOPY  01/10/2012   Procedure: ESOPHAGOGASTRODUODENOSCOPY (EGD);  Surgeon: Inda Castle, MD;  Location: [facility];  Service: Endoscopy;  Laterality: N/A;  . IVC FILTER PLACEMENT ([place] HX)    . Section of villous adenoma of colon      SOCIAL HISTORY: Social History   Social History  . Marital status: Married    Spouse name: N/A  . Number of children: N/A  . Years of education: N/A   Occupational History  . Not on file.   Social History Main Topics  . Smoking status: Never Smoker  . Smokeless tobacco: Never Used  . Alcohol use No  . Drug use: No  . Sexual activity: Not on file   Other Topics Concern  . Not on file   Social History Narrative  . No narrative on file    FAMILY HISTORY: Family History  Problem Relation Age of Onset  . Cancer Father   . Cancer Mother   . Cancer Maternal Aunt   . Cancer Sister     ALLERGIES:  has No Known Allergies.  MEDICATIONS:  Current Facility-Administered Medications  Medication Dose Route Frequency Provider Last Rate Last Dose  . 0.9 %  sodium chloride infusion   Intravenous Once Rise Patience, MD      . 0.9 %  sodium chloride infusion   Intravenous Continuous Rise Patience, MD 75 mL/hr at 12/12/15 [phone]    . acetaminophen (TYLENOL) tablet 650 mg  650 mg Oral Q6H PRN Rise Patience, MD   650 mg at 12/12/15 0105   Or  . acetaminophen (TYLENOL) suppository  650 mg  650 mg Rectal Q6H PRN Rise Patience, MD      . atorvastatin (LIPITOR) tablet 10 mg  10 mg Oral QHS Rise Patience, MD   10 mg at 12/12/15 0105  . baclofen (LIORESAL) tablet 10 mg  10 mg Oral BID Rise Patience, MD   10 mg at 12/12/15 0105  . ipratropium-albuterol (DUONEB) 0.5-2.5 (3) MG/3ML nebulizer solution 3 mL  3 mL Nebulization Q8H PRN Rise Patience, MD      . ondansetron [facility]) tablet 4 mg  4 mg Oral Q6H PRN Rise Patience, MD       Or  . ondansetron [facility]) injection  4 mg  4 mg Intravenous Q6H PRN Rise Patience, MD      . pantoprazole (PROTONIX) 80 mg in sodium chloride 0.9 % 250 mL (0.32 mg/mL) infusion  8 mg/hr Intravenous Continuous Rise Patience, MD 25 mL/hr at 12/12/15 0752 8 mg/hr at 12/12/15 0752  . [START ON 12/15/2015] pantoprazole (PROTONIX) injection 40 mg  40 mg Intravenous Q12H Rise Patience, MD      . piperacillin-tazobactam (ZOSYN) IVPB 3.375 g  3.375 g Intravenous Q8H David Tat, MD      . sertraline (ZOLOFT) tablet 25 mg  25 mg Oral Daily Rise Patience, MD      . traZODone (DESYREL) tablet 50 mg  50 mg Oral QHS Rise Patience, MD   50 mg at 12/12/15 0105  . vancomycin (VANCOCIN) 1,250 mg in sodium chloride 0.9 % 250 mL IVPB  1,250 mg Intravenous Q12H Erenest Blank, RPH   1,250 mg at 12/12/15 0126    REVIEW OF SYSTEMS:  The patient had mild dysphasia. Review of systems inaccurate  PHYSICAL EXAMINATION: ECOG PERFORMANCE STATUS: 4 - Bedbound  Vitals:   12/12/15 0430 12/12/15 0741  BP: 126/73 (!) 160/86  Pulse:  (!) 114  Resp: 16 16  Temp: 100 F (37.8 C) 99.4 F (37.4 C)   Filed Weights   12/12/15 0018 12/12/15 0403  Weight: 211 lb 11.2 oz (96 kg) 216 lb 14.4 oz (98.4 kg)    GENERAL:alert, no distress and comfortable SKIN: skin color, texture, turgor are normal, no rashes or significant lesions EYES: normal, conjunctiva are pink and non-injected, sclera clear OROPHARYNX:no exudate, no erythema and lips, buccal mucosa, and tongue normal  NECK: supple, thyroid normal size, non-tender, without nodularity LYMPH:  no palpable lymphadenopathy in the cervical, axillary or inguinal LUNGS: clear to auscultation and percussion with normal breathing effort HEART: regular rate & rhythm and no murmurs With moderate to severe lower extremity edema ABDOMEN:abdomen soft, non-tender and normal bowel sounds Musculoskeletal: Noted peripheral cyanosis PSYCH: alert & oriented x 3 with Mild dysphasia, NEURO: Noted  neurological deficit  LABORATORY DATA:  I have reviewed the data as listed Lab Results  Component Value Date   WBC 15.2 (H) 12/12/2015   HGB 6.6 (LL) 12/12/2015   HCT 20.2 (L) 12/12/2015   MCV 80.8 12/12/2015   PLT 111 (L) 12/12/2015    Recent Labs  06/14/15 06/16/15 11/11/15 12/11/15 1735 12/12/15 0304  NA 138 138 137 131* 133*  K 4.4 4.0 4.0 3.6 3.5  CL  --   --   --  94* 101  CO2  --   --   --  26 23  GLUCOSE  --   --   --  162* 122*  BUN 9 9 9 13 10   CREATININE 0.8 0.8 0.8 0.83 0.72  CALCIUM  --   --   --  8.5* 7.8*  GFRNONAA  --   --   --  >60 >60  GFRAA  --   --   --  >60 >60  PROT  --   --   --   --  5.1*  ALBUMIN  --   --   --   --  2.6*  AST 14 12*  --   --  23  ALT 12 10  --   --  18  ALKPHOS 78 78  --   --  101  BILITOT  --   --   --   --  1.2    RADIOGRAPHIC STUDIES:I reviewed imaging study with him and family I have personally reviewed the radiological images as listed and agreed with the findings in the report. Ct Angio Chest Pe W Or Wo Contrast  Result Date: 12/11/2015 CLINICAL DATA:  Chest pain.  No history of cancer hypertension. EXAM: CT ANGIOGRAPHY CHEST WITH CONTRAST TECHNIQUE: Multidetector CT imaging of the chest was performed using the standard protocol during bolus administration of intravenous contrast. Multiplanar CT image reconstructions and MIPs were obtained to evaluate the vascular anatomy. CONTRAST:  80 cc IV Isovue 370 COMPARISON:  09/25/2009 chest CT FINDINGS: Cardiovascular: Top normal size cardiac chambers without pericardial effusion or thickening. Coronary arteriosclerosis noted. No aortic aneurysm or dissection. No large central pulmonary embolus third order branches. Beyond this, study is limited for tiny pulmonary emboli due to motion artifacts. Mediastinum/Nodes: No enlarged mediastinal, hilar, or axillary lymph nodes. Thyroid gland, trachea, and esophagus demonstrate no significant findings. A moderate-sized hiatal hernia is present.  Lungs/Pleura: No pleural effusion. Minimal bibasilar atelectasis. Small pulmonary blebs in the right upper lobe. No pneumothorax or pulmonary consolidations. Upper Abdomen: In homogeneous masslike abnormalities of the visualized liver concerning for neoplasm. Parapelvic left renal cysts. Possible mild left hydroureter versus extra renal pelvis incompletely imaged. Moderate to marked amount of fecal residue within the visualized large bowel. Musculoskeletal: No acute osseous abnormality. Review of the MIP images confirms the above findings. IMPRESSION: No large central pulmonary embolus to the third order branches. Beyond this, assessment is limited by respiratory motion artifacts. Inhomogeneous masslike abnormalities of the visualized liver concerning for neoplasm possibly metastatic. Electronically Signed   By: Ashley Royalty M.D.   On: 12/11/2015 19:39    ASSESSMENT & PLAN:  Multiple liver mass, history of abnormal duodenal mass Suspect chronic GI bleed from duodenal mass, on anticoagulation therapy Protein calorie malnutrition Significant bilateral lower extremity DVT, and background history of IVC filter placement Severe anemia due to GI bleed Thrombocytopenia likely due to liver disease and consumption History of stroke with poor baseline performance status  I had a long discussion with patient and family members His wife understood poor prognosis We discussed transitioning his care to comfort care and she agreed We agreed CODE STATUS should be DO NOT RESUSCITATE We agreed to stop anticoagulation therapy We agreed no blood transfusion We agreed to consult hospice and transition him to residential facility for end-of-life care. I estimate prognosis less than 2 weeks without further blood transfusion   All questions were answered. The patient knows to call the clinic with any problems, questions or concerns.    Heath Lark, MD 12/12/2015 11:12 AM

## 2015-12-12 NOTE — H&P (Signed)
History and Physical    Tyler Beard [id number] DOB: [birth year] DOA: 12/11/2015  PCP: Blanchie Serve, MD  Patient coming from: Nursing home.  Chief Complaint: Left lower extremity swelling.  HPI: Tyler Beard is a 81 y.o. male with with history of stroke with right-sided hemiplegia, recently diagnosed right lower extremity DVT and history of PE on IVC filter and Coumadin was noticed to have left lower extremity swelling and was referred to the ER. In the ER Dopplers were done which was positive for DVT of the left lower extremity. (Results of which are not available to me but was told by the ER physician). Patient was found to be tachycardic for which patient had a CT angiogram done which was negative for PE. Hemoglobin was found to be around 6 and as per the ER physician stool for occult was positive but not melanotic. Patient does have a history of duodenal villous adenoma which as per the gastroenterologist will require Whipple's procedure and patient is not a candidate for it. Patient is being admitted for acute blood loss anemia and possibly acute on chronic GI bleed likely from the villous adenoma. Patient's INR is therapeutic despite which patient has developed a new DVT.   ED Course: Patient was started on packed red blood cell transfusion. Dopplers done were positive for DVT as per the physician. Official results to be followed. Patient was also found to be febrile even before transfusion. Lactate is elevated and was given fluid bolus.  Review of Systems: As per HPI, rest all negative.   Past Medical History:  Diagnosis Date  . Cataracts, bilateral   . Cerebrovascular accident ([place]) 2007  . Depression   . GERD (gastroesophageal reflux disease)   . Glaucoma   . Hypercholesterolemia   . Hypertension     Past Surgical History:  Procedure Laterality Date  . aphagia    . cataract surgery     bilateral  . ESOPHAGOGASTRODUODENOSCOPY  01/10/2012   Procedure:  ESOPHAGOGASTRODUODENOSCOPY (EGD);  Surgeon: Inda Castle, MD;  Location: [facility];  Service: Endoscopy;  Laterality: N/A;  . Section of villous adenoma of colon       reports that he has never smoked. He has never used smokeless tobacco. He reports that he does not drink alcohol or use drugs.  No Known Allergies  Family History  Problem Relation Age of Onset  . Cancer Father   . Cancer Mother   . Cancer Maternal Aunt   . Cancer Sister     Prior to Admission medications   Medication Sig Start Date End Date Taking? Authorizing Provider  atorvastatin (LIPITOR) 10 MG tablet Take 10 mg by mouth at bedtime. For cholesterol/CVA   Yes Historical Provider, MD  baclofen (LIORESAL) 10 MG tablet Take 10 mg by mouth 2 (two) times daily. For Spastic Hemiparesis   Yes Historical Provider, MD  calcium carbonate (OS-CAL - DOSED IN MG OF ELEMENTAL CALCIUM) 1250 (500 Ca) MG tablet Take 1 tablet by mouth daily.   Yes Historical Provider, MD  Cholecalciferol (VITAMIN D) 2000 UNITS CAPS Take 4,000 Units by mouth daily. For Vitamin D Deficiency   Yes Historical Provider, MD  ferrous sulfate 325 (65 FE) MG tablet Take 325 mg by mouth 2 (two) times daily. For anemia   Yes Historical Provider, MD  ipratropium-albuterol (DUONEB) 0.5-2.5 (3) MG/3ML SOLN Take 3 mLs by nebulization every 8 (eight) hours as needed (for wheezing ot shortness of breath).    Yes Historical Provider, MD  lactulose,  encephalopathy, (GENERLAC) 10 GM/15ML SOLN Take 20 g by mouth 2 (two) times daily.   Yes Historical Provider, MD  linaclotide (LINZESS) 145 MCG CAPS capsule Take 145 mcg by mouth daily before breakfast.   Yes Historical Provider, MD  loratadine (CLARITIN) 10 MG tablet Take 10 mg by mouth daily.   Yes Historical Provider, MD  Melatonin 5 MG TABS Take 5 mg by mouth at bedtime.    Yes Historical Provider, MD  pantoprazole (PROTONIX) 40 MG tablet Take 40 mg by mouth daily. For GERD   Yes Historical Provider, MD  polyethylene  glycol (MIRALAX / GLYCOLAX) packet Take 17 g by mouth daily. MIXED WITH 4-8 OUNCES OF FLUID   Yes Historical Provider, MD  Propylene Glycol (SYSTANE BALANCE OP) Place 1 drop into both eyes 3 (three) times daily. Both eyes    Yes Historical Provider, MD  sennosides-docusate sodium (SENOKOT-S) 8.6-50 MG tablet Take 1 tablet by mouth 2 (two) times daily. For constipation   Yes Historical Provider, MD  sertraline (ZOLOFT) 25 MG tablet Take 25 mg by mouth every morning. For depression   Yes Historical Provider, MD  sucralfate (CARAFATE) 1 G tablet Take 1 g by mouth 4 (four) times daily.    Yes Historical Provider, MD  traZODone (DESYREL) 50 MG tablet Take 50 mg by mouth at bedtime.    Yes Historical Provider, MD  vitamin B-12 (CYANOCOBALAMIN) 500 MCG tablet Take 500 mcg by mouth daily.   Yes Historical Provider, MD  warfarin (COUMADIN) 2 MG tablet Take 2 mg by mouth daily. In conjunction with one 2.5 mg tablet to equal a total of 4.5 milligrams   Yes Historical Provider, MD  warfarin (COUMADIN) 2.5 MG tablet Take 2.5 mg by mouth daily. In conjunction with one 2 mg tablet to equal a total of 4.5 milligrams   Yes Historical Provider, MD  enoxaparin (LOVENOX) 80 MG/0.8ML injection Inject 80 mg into the skin every 12 (twelve) hours.    Historical Provider, MD    Physical Exam: Vitals:   12/11/15 2245 12/11/15 2300 12/11/15 2315 12/11/15 2330  BP: 128/82 128/81 147/70 137/75  Pulse: (!) 122 (!) 122 120 118  Resp: 21 21 25 22   Temp:      TempSrc:      SpO2: 99% 99% 99% 99%      Constitutional: Moderately built and nourished. Vitals:   12/11/15 2245 12/11/15 2300 12/11/15 2315 12/11/15 2330  BP: 128/82 128/81 147/70 137/75  Pulse: (!) 122 (!) 122 120 118  Resp: 21 21 25 22   Temp:      TempSrc:      SpO2: 99% 99% 99% 99%   Eyes: Anicteric mild pallor. ENMT: No discharge from the ears eyes nose and mouth. Neck: No mass felt. No JVD appreciated. Respiratory: No rhonchi or  crepitations. Cardiovascular: S1-S2 heard. No murmurs appreciated. Abdomen: Soft nontender bowel sounds present. Musculoskeletal: Left lower extremity swollen up to the thigh. Skin: No skin rash or skin breakdown. Neurologic: Alert awake oriented to time place and person. Has right-sided hemiplegia. Psychiatric: Appears normal normal affect.   Labs on Admission: I have personally reviewed following labs and imaging studies  CBC:  Recent Labs Lab 12/11/15 1735  WBC 14.3*  NEUTROABS 10.9*  HGB 6.5*  HCT 21.1*  MCV 80.8  PLT 0000000*   Basic Metabolic Panel:  Recent Labs Lab 12/11/15 1735  NA 131*  K 3.6  CL 94*  CO2 26  GLUCOSE 162*  BUN 13  CREATININE 0.83  CALCIUM 8.5*   GFR: Estimated Creatinine Clearance: 81 mL/min (by C-G formula based on SCr of 0.83 mg/dL). Liver Function Tests: No results for input(s): AST, ALT, ALKPHOS, BILITOT, PROT, ALBUMIN in the last 168 hours. No results for input(s): LIPASE, AMYLASE in the last 168 hours. No results for input(s): AMMONIA in the last 168 hours. Coagulation Profile:  Recent Labs Lab 12/11/15 1735  INR 2.04   Cardiac Enzymes:  Recent Labs Lab 12/11/15 1735  TROPONINI <0.03   BNP (last 3 results) No results for input(s): PROBNP in the last 8760 hours. HbA1C: No results for input(s): HGBA1C in the last 72 hours. CBG: No results for input(s): GLUCAP in the last 168 hours. Lipid Profile: No results for input(s): CHOL, HDL, LDLCALC, TRIG, CHOLHDL, LDLDIRECT in the last 72 hours. Thyroid Function Tests: No results for input(s): TSH, T4TOTAL, FREET4, T3FREE, THYROIDAB in the last 72 hours. Anemia Panel: No results for input(s): VITAMINB12, FOLATE, FERRITIN, TIBC, IRON, RETICCTPCT in the last 72 hours. Urine analysis:    Component Value Date/Time   COLORURINE YELLOW 09/25/2009 0504   APPEARANCEUR CLEAR 09/25/2009 0504   LABSPEC 1.016 09/25/2009 0504   PHURINE 7.5 09/25/2009 0504   GLUCOSEU NEGATIVE 09/25/2009  0504   HGBUR LARGE (A) 09/25/2009 0504   BILIRUBINUR NEGATIVE 09/25/2009 0504   KETONESUR TRACE (A) 09/25/2009 0504   PROTEINUR NEGATIVE 09/25/2009 0504   UROBILINOGEN 1.0 09/25/2009 0504   NITRITE NEGATIVE 09/25/2009 0504   LEUKOCYTESUR NEGATIVE 09/25/2009 0504   Sepsis Labs: @LABRCNTIP (procalcitonin:4,lacticidven:4) )No results found for this or any previous visit (from the past 240 hour(s)).   Radiological Exams on Admission: Ct Angio Chest Pe W Or Wo Contrast  Result Date: 12/11/2015 CLINICAL DATA:  Chest pain.  No history of cancer hypertension. EXAM: CT ANGIOGRAPHY CHEST WITH CONTRAST TECHNIQUE: Multidetector CT imaging of the chest was performed using the standard protocol during bolus administration of intravenous contrast. Multiplanar CT image reconstructions and MIPs were obtained to evaluate the vascular anatomy. CONTRAST:  80 cc IV Isovue 370 COMPARISON:  09/25/2009 chest CT FINDINGS: Cardiovascular: Top normal size cardiac chambers without pericardial effusion or thickening. Coronary arteriosclerosis noted. No aortic aneurysm or dissection. No large central pulmonary embolus third order branches. Beyond this, study is limited for tiny pulmonary emboli due to motion artifacts. Mediastinum/Nodes: No enlarged mediastinal, hilar, or axillary lymph nodes. Thyroid gland, trachea, and esophagus demonstrate no significant findings. A moderate-sized hiatal hernia is present. Lungs/Pleura: No pleural effusion. Minimal bibasilar atelectasis. Small pulmonary blebs in the right upper lobe. No pneumothorax or pulmonary consolidations. Upper Abdomen: In homogeneous masslike abnormalities of the visualized liver concerning for neoplasm. Parapelvic left renal cysts. Possible mild left hydroureter versus extra renal pelvis incompletely imaged. Moderate to marked amount of fecal residue within the visualized large bowel. Musculoskeletal: No acute osseous abnormality. Review of the MIP images confirms the  above findings. IMPRESSION: No large central pulmonary embolus to the third order branches. Beyond this, assessment is limited by respiratory motion artifacts. Inhomogeneous masslike abnormalities of the visualized liver concerning for neoplasm possibly metastatic. Electronically Signed   By: Ashley Royalty M.D.   On: 12/11/2015 19:39     Assessment/Plan Principal Problem:   Acute blood loss anemia Active Problems:   History of CVA with residual deficit   Anemia   Chronic upper GI bleeding   Recurrent deep vein thrombosis (DVT) ([place])    1. Acute blood loss anemia probably from acute on chronic GI bleed - patient has known history of duodenal villous  adenoma which could be a source of bleed. At this time I will keep patient nothing by mouth except medications and on Protonix infusion. 2 units of packed red blood cell transfusion has been ordered. Follow CBC. Hold Coumadin for now. Patient does have a history of IVC filter placement. 2. SIRS - source not clear. Will obtain blood cultures UA, urine cultures and check influenza PCR. For now we will gently hydrate and follow lactate levels. Will keep patient on empiric antibiotics for now. 3. History of recurrent DVT and PE status post IVC filter placement - patient has developed a new DVT in the left lower extremity despite therapeutic INR. Now since patient has possible GI bleed we'll hold off anticoagulation. Patient does have IVC filter placement as per the records. Closely observe. May need a input from hematologist in a.m. 4. History of CVA with right-sided hemiplegia - on statin. Cannot give antiplatelet agents due to GI bleed. 5. Thrombocytopenia - could be from DVT. Patient is also febrile. Follow cultures and follow CBC closely.   DVT prophylaxis: None. Since patient has GI bleed and DVT. Code Status: Full code.  Family Communication: Discussed with patient.  Disposition Plan: Skilled nursing facility.  Consults called: None.  Admission  status: Inpatient. Likely stay 3-4 days.    Rise Patience MD [facility] Pager [phone].  If 7PM-7AM, please contact night-coverage [email or web address] Password [email or web address]  12/12/2015, 12:17 AM

## 2015-12-12 NOTE — Progress Notes (Addendum)
PROGRESS NOTE  Tyler Beard [id number] DOB: [birth year] DOA: 12/11/2015 PCP: Blanchie Serve, MD  Brief History:  81 year old male with a history of stroke with left hemiplegia, right lower extremity DVT on warfarin, pulmonary embolus with history of IVC filter, hypertension, hyperlipidemia presented with increasing left lower extremity edema and pain. Venous Doppler at outside facility was obtained and revealed extensive DVT in the common femoral vein, superficial femoral vein, popliteal, and peroneal veins. The patient was sent to the emergency department from [facility] for further workup. Routine blood work revealed in the emergency department hemoglobin of 6.5. The patient states that he has noted "dark stools", but denies any hematochezia or hematemesis. The patient attributes his dark stool to taking iron. Other than his lower extremity pain, the patient has been essentially asymptomatic.  Patient denies fevers, chills, headache, chest pain, dyspnea, nausea, vomiting, diarrhea, abdominal pain, dysuria, hematuria, hematochezia. After admission, hematology, vascular surgery, and gastroenterology were consulted to assist with management. 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 Deatra Ina. 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  In addition, the patient has had a previous history of PE and DVT for which he had an IVC filter placed in June 2007. The patient's INR was therapeutic at 2.04 at the time of admission.   Assessment/Plan: Acute blood loss anemia/Melena -Transfused 2 additional units PRBC-->recheck CBC -consulted  [facility] -trend Hgb -Rectal by EDP-->melena without hematochezia  -continue protonix drip for now pending GI eval  Acute LLE DVT superimposed upon hx of RLE DVT and PE -outside facility duplex--thrombus in left common femoral vein,  superficial femoral vein, popliteal, and peroneal veins -INR 2.04 at time of admission -concerned about cool left foot, possible ischemia-->consulted vascular surgery -consulted Hematology--discussed with Dr. Dory Larsen for now, consult palliative medicine, will see patient -d/c warfarin  Fever/SIRS -CT chest neg for PE, no infiltrates, consolidations -UA--no pyuria -follow blood culture -MRSA screen -empiric vanc and zosyn pending culture data -likely due to extensive DVT and possible ischemic foot  Tubulovillous adenoma -not a candidate for resection -defer to GI  Thrombocytopenia -due to consumption from new DVT in setting of warfarin use -trend  History of stroke -residual Left hemiplegia -wheelchair bound  Hyponatremia -likely volume depletion -improving with fluid resuscitation    Disposition Plan:   Not stable for d/c  Family Communication:  No Family at bedside--Total time spent 45 minutes.  Greater than 50% spent face to face counseling and coordinating care. 0915 to 1000   Consultants:  [facility], Hematology, [facility], Palliative medicine  Code Status:  FULL  DVT Prophylaxis:  None in setting of bleeding and acute DVT   Procedures: As Listed in Progress Note Above  Antibiotics: None    Subjective: Patient denies fevers, chills, headache, chest pain, dyspnea, nausea, vomiting, diarrhea, abdominal pain, dysuria, hematuria, hematochezia, and melena.  C/o LLE pain, sharp, constant   Objective: Vitals:   12/12/15 0110 12/12/15 0403 12/12/15 0430 12/12/15 0741  BP: 140/86 132/84 126/73 (!) 160/86  Pulse: (!) 115 (!) 115  (!) 114  Resp: 20 16 16 16   Temp: (!) 100.5 F (38.1 C) (!) 100.6 F (38.1 C) 100 F (37.8 C) 99.4 F (37.4 C)  TempSrc: Oral Oral Oral Oral  SpO2:  99%  99%  Weight:  98.4 kg (216 lb 14.4 oz)      Intake/Output  Summary (Last 24 hours) at 12/12/15 0935 Last data filed at 12/12/15 0741  Gross per 24 hour  Intake            3167.5 ml  Output                0 ml  Net           3167.5 ml   Weight change:  Exam:   General:  Pt is alert, follows commands appropriately, not in acute distress  HEENT: No icterus, No thrush, No neck mass, [place]/AT  Cardiovascular: RRR, S1/S2, no rubs, no gallops  Respiratory: CTA bilaterally, no wheezing, no crackles, no rhonchi  Abdomen: Soft/+BS, non tender, non distended, no guarding  Extremities: 2+ LLE edema, left foot and toes cool and ecchymotic   Data Reviewed: I have personally reviewed following labs and imaging studies Basic Metabolic Panel:  Recent Labs Lab 12/11/15 1735 12/12/15 0304  NA 131* 133*  K 3.6 3.5  CL 94* 101  CO2 26 23  GLUCOSE 162* 122*  BUN 13 10  CREATININE 0.83 0.72  CALCIUM 8.5* 7.8*   Liver Function Tests:  Recent Labs Lab 12/12/15 0304  AST 23  ALT 18  ALKPHOS 101  BILITOT 1.2  PROT 5.1*  ALBUMIN 2.6*   No results for input(s): LIPASE, AMYLASE in the last 168 hours. No results for input(s): AMMONIA in the last 168 hours. Coagulation Profile:  Recent Labs Lab 12/11/15 1735  INR 2.04   CBC:  Recent Labs Lab 12/11/15 1735 12/12/15 0304  WBC 14.3* 15.2*  NEUTROABS 10.9*  --   HGB 6.5* 6.6*  HCT 21.1* 20.2*  MCV 80.8 80.8  PLT 138* 111*   Cardiac Enzymes:  Recent Labs Lab 12/11/15 1735  TROPONINI <0.03   BNP: Invalid input(s): POCBNP CBG: No results for input(s): GLUCAP in the last 168 hours. HbA1C: No results for input(s): HGBA1C in the last 72 hours. Urine analysis:    Component Value Date/Time   COLORURINE YELLOW 12/12/2015 0805   APPEARANCEUR CLEAR 12/12/2015 0805   LABSPEC 1.040 (H) 12/12/2015 0805   PHURINE 5.5 12/12/2015 0805   GLUCOSEU NEGATIVE 12/12/2015 0805   HGBUR NEGATIVE 12/12/2015 0805   BILIRUBINUR NEGATIVE 12/12/2015 0805   KETONESUR NEGATIVE 12/12/2015 0805   PROTEINUR NEGATIVE 12/12/2015 0805   UROBILINOGEN 1.0 09/25/2009 0504   NITRITE NEGATIVE 12/12/2015 0805    LEUKOCYTESUR NEGATIVE 12/12/2015 0805   Sepsis Labs: @LABRCNTIP (procalcitonin:4,lacticidven:4) ) Recent Results (from the past 240 hour(s))  MRSA PCR Screening     Status: None   Collection Time: 12/12/15 12:04 AM  Result Value Ref Range Status   MRSA by PCR NEGATIVE NEGATIVE Final    Comment:        The GeneXpert MRSA Assay (FDA approved for NASAL specimens only), is one component of a comprehensive MRSA colonization surveillance program. It is not intended to diagnose MRSA infection nor to guide or monitor treatment for MRSA infections.      Scheduled Meds: . sodium chloride   Intravenous Once  . atorvastatin  10 mg Oral QHS  . baclofen  10 mg Oral BID  . [START ON 12/15/2015] pantoprazole  40 mg Intravenous Q12H  . piperacillin-tazobactam (ZOSYN)  IV  3.375 g Intravenous Q8H  . sertraline  25 mg Oral Daily  . traZODone  50 mg Oral QHS  . vancomycin  1,250 mg Intravenous Q12H   Continuous Infusions: . sodium chloride 75 mL/hr at 12/12/15 0752  . pantoprozole (PROTONIX) infusion 8  mg/hr (12/12/15 0752)    Procedures/Studies: Ct Angio Chest Pe W Or Wo Contrast  Result Date: 12/11/2015 CLINICAL DATA:  Chest pain.  No history of cancer hypertension. EXAM: CT ANGIOGRAPHY CHEST WITH CONTRAST TECHNIQUE: Multidetector CT imaging of the chest was performed using the standard protocol during bolus administration of intravenous contrast. Multiplanar CT image reconstructions and MIPs were obtained to evaluate the vascular anatomy. CONTRAST:  80 cc IV Isovue 370 COMPARISON:  09/25/2009 chest CT FINDINGS: Cardiovascular: Top normal size cardiac chambers without pericardial effusion or thickening. Coronary arteriosclerosis noted. No aortic aneurysm or dissection. No large central pulmonary embolus third order branches. Beyond this, study is limited for tiny pulmonary emboli due to motion artifacts. Mediastinum/Nodes: No enlarged mediastinal, hilar, or axillary lymph nodes. Thyroid gland,  trachea, and esophagus demonstrate no significant findings. A moderate-sized hiatal hernia is present. Lungs/Pleura: No pleural effusion. Minimal bibasilar atelectasis. Small pulmonary blebs in the right upper lobe. No pneumothorax or pulmonary consolidations. Upper Abdomen: In homogeneous masslike abnormalities of the visualized liver concerning for neoplasm. Parapelvic left renal cysts. Possible mild left hydroureter versus extra renal pelvis incompletely imaged. Moderate to marked amount of fecal residue within the visualized large bowel. Musculoskeletal: No acute osseous abnormality. Review of the MIP images confirms the above findings. IMPRESSION: No large central pulmonary embolus to the third order branches. Beyond this, assessment is limited by respiratory motion artifacts. Inhomogeneous masslike abnormalities of the visualized liver concerning for neoplasm possibly metastatic. Electronically Signed   By: Ashley Royalty M.D.   On: 12/11/2015 19:39    Yoon Barca, DO  [facility] Pager [phone]  If 7PM-7AM, please contact night-coverage [email or web address] Password [email or web address] 12/12/2015, 9:35 AM   LOS: 1 day

## 2015-12-13 DIAGNOSIS — D696 Thrombocytopenia, unspecified: Secondary | ICD-10-CM

## 2015-12-13 DIAGNOSIS — R6510 Systemic inflammatory response syndrome (SIRS) of non-infectious origin without acute organ dysfunction: Secondary | ICD-10-CM

## 2015-12-13 DIAGNOSIS — K922 Gastrointestinal hemorrhage, unspecified: Secondary | ICD-10-CM

## 2015-12-13 LAB — TYPE AND SCREEN
ABO/RH(D): O POS
Antibody Screen: NEGATIVE
Unit division: 0
Unit division: 0

## 2015-12-13 LAB — URINE CULTURE: Culture: NO GROWTH

## 2015-12-13 MED ORDER — MORPHINE SULFATE (CONCENTRATE) 10 MG/0.5ML PO SOLN: 4.0000 mg | ORAL | 0 refills | Status: AC | PRN

## 2015-12-13 MED ORDER — MORPHINE SULFATE (CONCENTRATE) 10 MG/0.5ML PO SOLN: 4.0000 mg | ORAL | Status: DC | PRN

## 2015-12-13 NOTE — Clinical Social Work Note (Signed)
[facility] per MD for d/c today to [facility]. SW met with patient and wife who are agreeable to d/c. EMS arranged and RN notified to call report to facility. [facility] aware and on-call RN sent d/c summary to facility. No further SW needs identified. CSW signing off.  Lorie Phenix. Pauline Good, [place]  (weekend coverage)

## 2015-12-15 ENCOUNTER — Inpatient Hospital Stay: Payer: Medicare Other | Admitting: Hematology and Oncology

## 2015-12-15 ENCOUNTER — Telehealth: Payer: Self-pay | Admitting: Internal Medicine

## 2015-12-15 NOTE — Telephone Encounter (Signed)
Possible re-admission to facility  - This is a patient you were seeing at **[facility]** - [facility] fu is needed IF patient was re-admitted to facility upon discharge. Hospital discharge **12/13/15**

## 2015-12-17 LAB — CULTURE, BLOOD (ROUTINE X 2)
CULTURE: NO GROWTH
Culture: NO GROWTH

## 2016-01-02 ENCOUNTER — Telehealth: Payer: Self-pay | Admitting: *Deleted

## 2016-01-02 NOTE — Telephone Encounter (Signed)
Pt's wife left a VM.  She wanted to speak w/ Dr. Alvy Bimler or her nurse to ask about pt's cause of death.  She is asking if pt died from his "liver cancer" or from a blood clot?  She also wanted to thank Dr. Alvy Bimler for her help and says she was very "impressed" by her.

## 2016-01-05 ENCOUNTER — Telehealth: Payer: Self-pay | Admitting: *Deleted

## 2016-01-05 NOTE — Telephone Encounter (Signed)
Pls call her back If I were to guess most likely cause of death would be blood clot

## 2016-01-05 NOTE — Telephone Encounter (Signed)
Spoke with wife. Told her Dr Alvy Bimler feels like cause of death was most likely a blood clot

## 2016-01-16 DEATH — deceased

## 2017-09-28 ENCOUNTER — Encounter: Payer: Self-pay | Admitting: Internal Medicine
